# Patient Record
Sex: Female | Born: 1995 | Hispanic: Yes | Smoking: Never smoker
Health system: Southern US, Community
[De-identification: ages and names within clinical notes are randomized; demographics above are authoritative.]

## PROBLEM LIST (undated history)

## (undated) ENCOUNTER — Inpatient Hospital Stay (HOSPITAL_COMMUNITY): Payer: Self-pay

## (undated) DIAGNOSIS — Z789 Other specified health status: Secondary | ICD-10-CM

## (undated) DIAGNOSIS — Z862 Personal history of diseases of the blood and blood-forming organs and certain disorders involving the immune mechanism: Secondary | ICD-10-CM

## (undated) HISTORY — PX: NO PAST SURGERIES: SHX2092

---

## 2014-10-05 ENCOUNTER — Other Ambulatory Visit (HOSPITAL_COMMUNITY): Payer: Self-pay | Admitting: Physician Assistant

## 2014-10-05 DIAGNOSIS — Z3689 Encounter for other specified antenatal screening: Secondary | ICD-10-CM

## 2014-10-05 LAB — OB RESULTS CONSOLE RPR: RPR: NONREACTIVE

## 2014-10-05 LAB — OB RESULTS CONSOLE RUBELLA ANTIBODY, IGM: Rubella: IMMUNE

## 2014-10-05 LAB — OB RESULTS CONSOLE ABO/RH: RH Type: POSITIVE

## 2014-10-05 LAB — OB RESULTS CONSOLE HEPATITIS B SURFACE ANTIGEN: Hepatitis B Surface Ag: NEGATIVE

## 2014-10-05 LAB — OB RESULTS CONSOLE HIV ANTIBODY (ROUTINE TESTING): HIV: NONREACTIVE

## 2014-10-05 LAB — OB RESULTS CONSOLE ANTIBODY SCREEN: Antibody Screen: NEGATIVE

## 2014-10-16 ENCOUNTER — Ambulatory Visit (HOSPITAL_COMMUNITY)
Admission: RE | Admit: 2014-10-16 | Discharge: 2014-10-16 | Disposition: A | Discharge status: Home / Self Care | Payer: Medicaid Other | Source: Ambulatory Visit | Attending: Physician Assistant | Admitting: Physician Assistant

## 2014-10-16 DIAGNOSIS — Z3689 Encounter for other specified antenatal screening: Secondary | ICD-10-CM

## 2014-10-16 DIAGNOSIS — Z36 Encounter for antenatal screening of mother: Secondary | ICD-10-CM | POA: Diagnosis present

## 2014-10-25 ENCOUNTER — Other Ambulatory Visit (HOSPITAL_COMMUNITY): Payer: Self-pay | Admitting: Nurse Practitioner

## 2014-10-25 DIAGNOSIS — Z3689 Encounter for other specified antenatal screening: Secondary | ICD-10-CM

## 2014-10-26 ENCOUNTER — Other Ambulatory Visit: Payer: Self-pay | Admitting: Infectious Disease

## 2014-10-26 ENCOUNTER — Ambulatory Visit
Admission: RE | Admit: 2014-10-26 | Discharge: 2014-10-26 | Disposition: A | Discharge status: Home / Self Care | Payer: Self-pay | Source: Ambulatory Visit | Attending: Infectious Disease | Admitting: Infectious Disease

## 2014-10-26 DIAGNOSIS — R7611 Nonspecific reaction to tuberculin skin test without active tuberculosis: Secondary | ICD-10-CM

## 2014-11-13 ENCOUNTER — Encounter (HOSPITAL_COMMUNITY): Payer: Self-pay

## 2014-11-13 ENCOUNTER — Ambulatory Visit (HOSPITAL_COMMUNITY)
Admission: RE | Admit: 2014-11-13 | Discharge: 2014-11-13 | Disposition: A | Discharge status: Home / Self Care | Payer: Medicaid Other | Source: Ambulatory Visit | Attending: Nurse Practitioner | Admitting: Nurse Practitioner

## 2014-11-13 DIAGNOSIS — Z3689 Encounter for other specified antenatal screening: Secondary | ICD-10-CM

## 2014-11-13 DIAGNOSIS — IMO0002 Reserved for concepts with insufficient information to code with codable children: Secondary | ICD-10-CM | POA: Insufficient documentation

## 2014-11-13 DIAGNOSIS — Z3A22 22 weeks gestation of pregnancy: Secondary | ICD-10-CM | POA: Diagnosis not present

## 2014-11-13 DIAGNOSIS — Z36 Encounter for antenatal screening of mother: Secondary | ICD-10-CM | POA: Diagnosis not present

## 2014-11-13 DIAGNOSIS — Z0489 Encounter for examination and observation for other specified reasons: Secondary | ICD-10-CM | POA: Insufficient documentation

## 2014-12-22 NOTE — L&D Delivery Note (Cosign Needed)
Delivery Note At 3:04 PM a viable female was delivered via Vaginal, Spontaneous Delivery (Presentation: Left Occiput Anterior).  APGAR: 9, 9; weight  .   Placenta status: Intact, Spontaneous.  Cord: 3 vessels with the following complications: None.  Cord pH: n/a  Anesthesia: Epidural  Episiotomy: None Lacerations: None Suture Repair: n/a Est. Blood Loss (mL): 100  Mom to postpartum.  Baby to Couplet care / Skin to Skin.  Wyvonnia DuskyLAWSON, MARIE DARLENE 03/18/2015, 3:28 PM

## 2015-01-17 IMAGING — US US OB COMP +14 WK
1 series · 12 of 28 positions shown · non-contrast
Comparison: none

[Series 1: us ob comp +14 wk mfm · 12 of 102 slices shown]
[im 4/102]
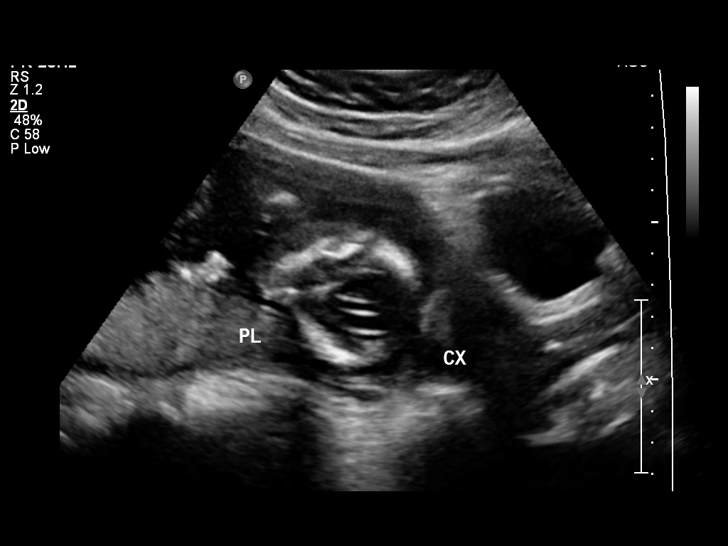
[im 12/102]
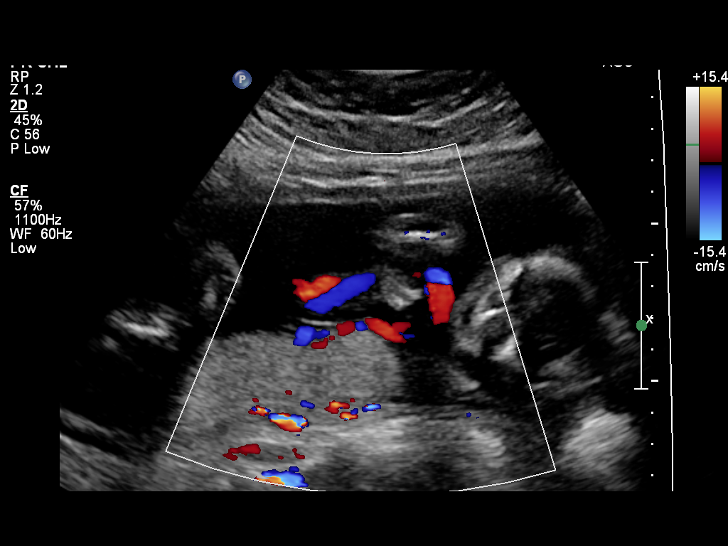
[im 19/102]
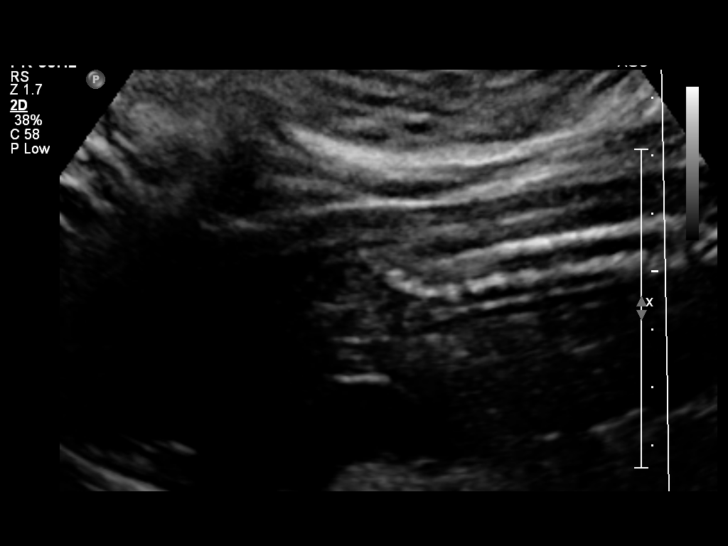
[im 30/102]
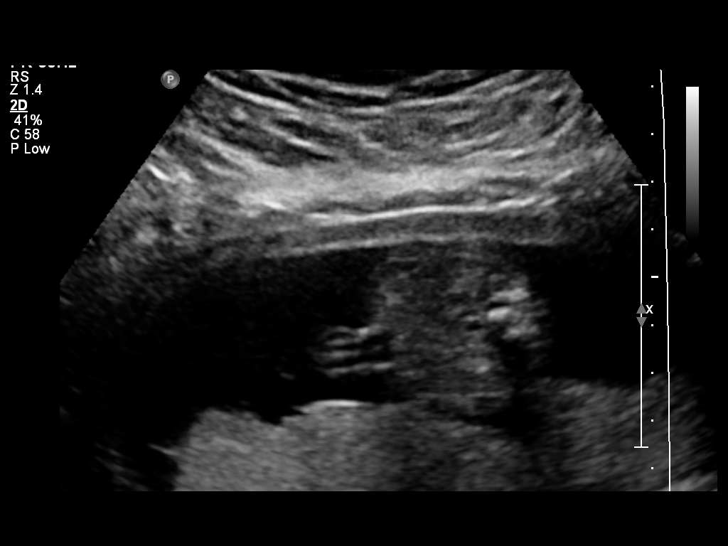
[im 38/102]
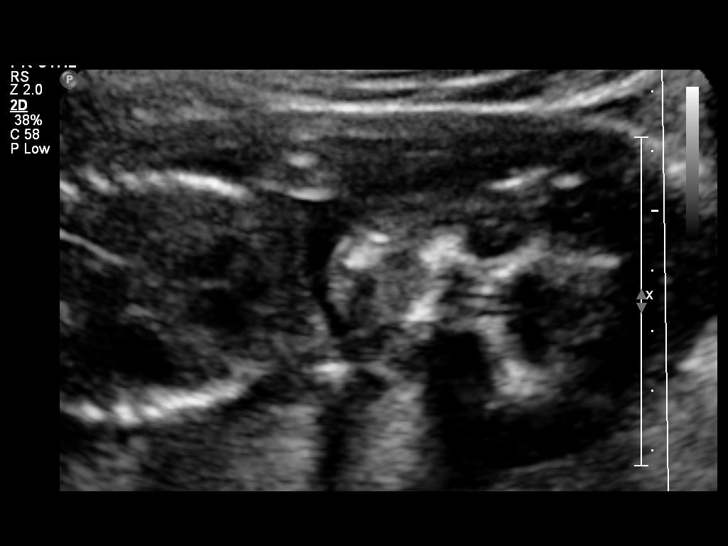
[im 45/102]
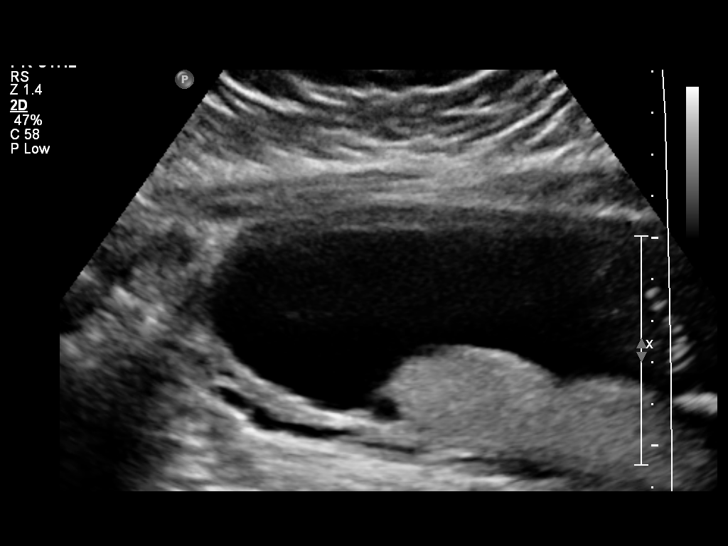
[im 57/102]
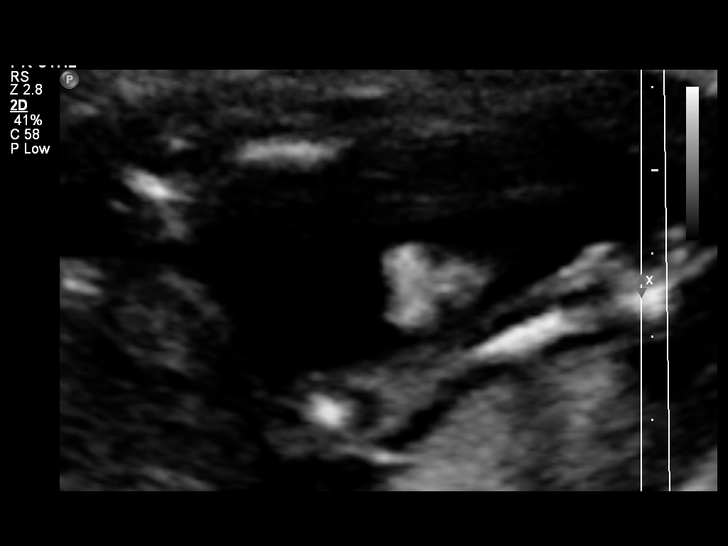
[im 64/102]
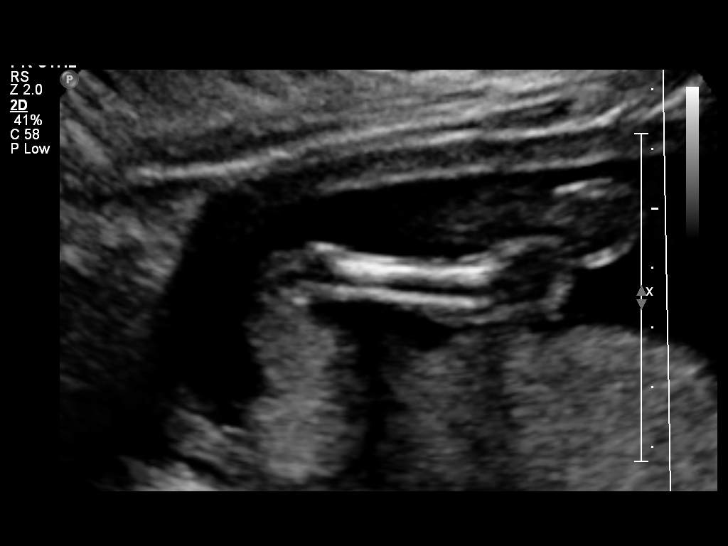
[im 72/102]
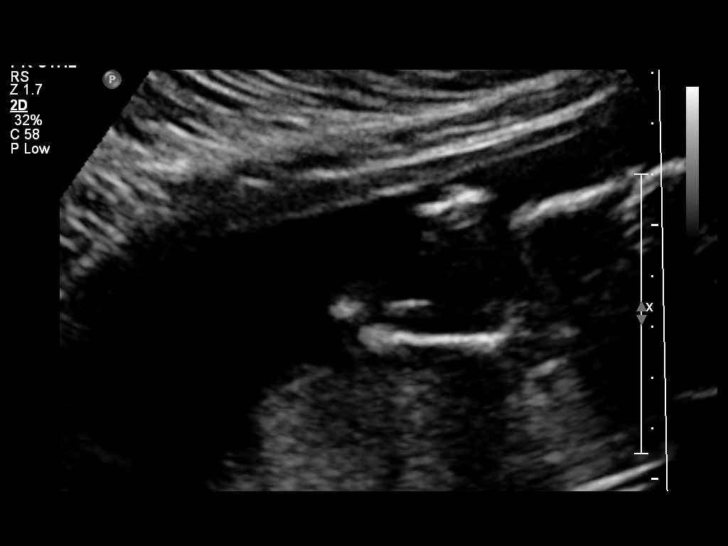
[im 83/102]
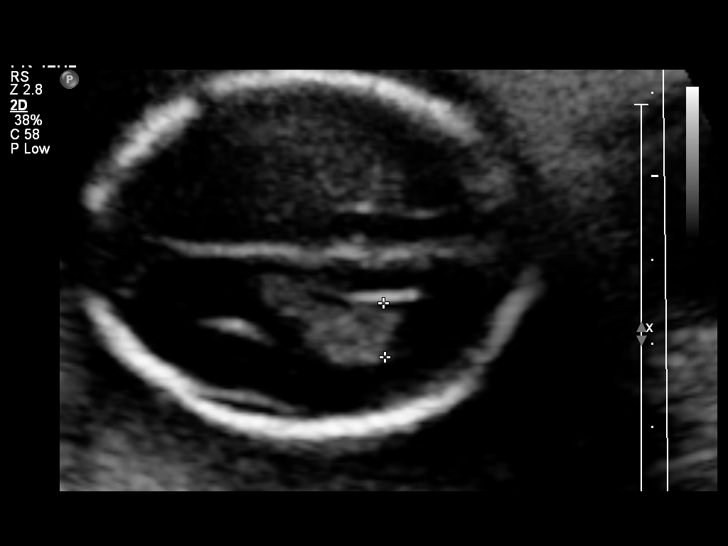
[im 90/102]
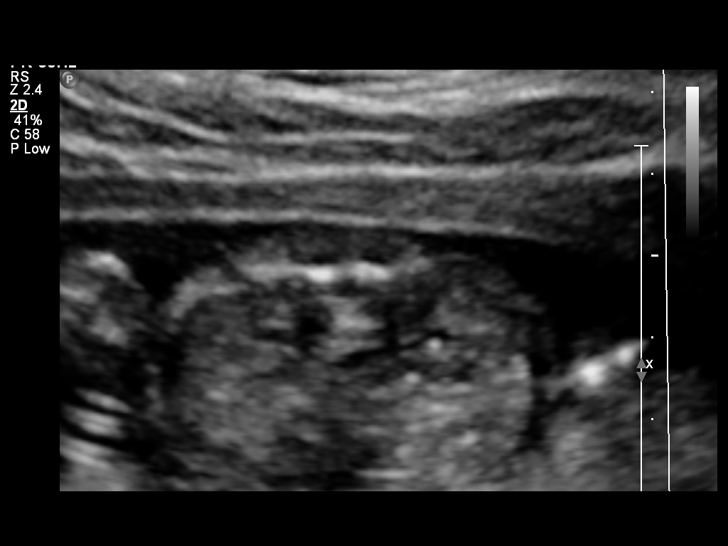
[im 98/102]
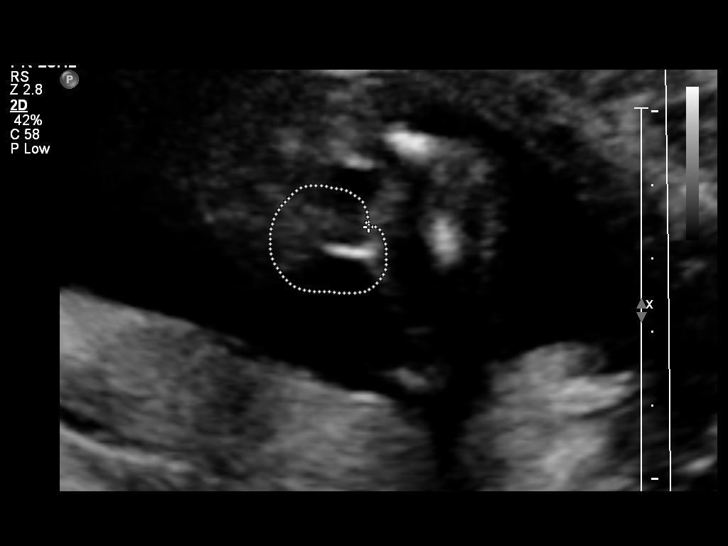

[12 of 28 positions shown; findings below may reference images not displayed]

OBSTETRICS REPORT
                      (Signed Final 10/16/2014 [DATE])

Service(s) Provided

 US OB COMP + 14 WK                                    76805.1
Indications

 Basic anatomic survey                                 [id number]
Fetal Evaluation

 Num Of Fetuses:    1
 Fetal Heart Rate:  147                          bpm
 Cardiac Activity:  Observed
 Presentation:      Cephalic
 Placenta:          Posterior, above cervical
                    os
 P. Cord            Visualized
 Insertion:

 Amniotic Fluid
 AFI FV:      Subjectively within normal limits
                                             Larg Pckt:     4.2  cm
Biometry

 BPD:     45.3  mm     G. Age:  19w 5d                CI:        78.43   70 - 86
                                                      FL/HC:      17.3   15.8 -
                                                                         18
 HC:     161.8  mm     G. Age:  19w 0d       69  %    HC/AC:      1.13   1.07 -

 AC:     142.6  mm     G. Age:  19w 4d       82  %    FL/BPD:
 FL:        28  mm     G. Age:  18w 4d       50  %    FL/AC:      19.6   20 - 24
 HUM:     27.6  mm     G. Age:  18w 6d       65  %
 NFT:      3.9  mm

 Est. FW:     276  gm    0 lb 10 oz      58  %
Gestational Age

 LMP:           18w 3d        Date:  06/09/14                 EDD:   03/16/15
 U/S Today:     19w 1d                                        EDD:   03/11/15
 Best:          18w 3d     Det. By:  LMP  (06/09/14)          EDD:   03/16/15
Anatomy

 Cranium:          Appears normal         Aortic Arch:      Appears normal
 Fetal Cavum:      Appears normal         Ductal Arch:      Not well visualized
 Ventricles:       Appears normal         Diaphragm:        Not well visualized
 Choroid Plexus:   Appears normal         Stomach:          Appears normal, left
                                                            sided
 Cerebellum:       Appears normal         Abdomen:          Appears normal
 Posterior Fossa:  Appears normal         Abdominal Wall:   Appears nml (cord
                                                            insert, abd wall)
 Nuchal Fold:      Appears normal         Cord Vessels:     Appears normal (3
                                                            vessel cord)
 Face:             Appears normal         Kidneys:          Appear normal
                   (orbits and profile)
 Lips:             Appears normal         Bladder:          Appears normal
 Heart:            Not well visualized    Spine:            Appears normal
 RVOT:             Not well visualized    Lower             Appears normal
                                          Extremities:
 LVOT:             Not well visualized    Upper             Appears normal
                                          Extremities:

 Other:  Fetus appears to be a female.Heels visualized. Technically difficult
         due to maternal habitus and fetal position.
Cervix Uterus Adnexa

 Cervical Length:    3.1      cm

 Cervix:       Normal appearance by transabdominal scan.

 Adnexa:     No abnormality visualized.
Impression

 Single IUP at 18w 3d
 Normal fetal anatomic survey
 Limited views of the fetal heart were obtained due to fetal
 position
 No markers associated with aneuploidy noted
 Normal amniotic fluid volume
Recommendations

 Recommend follow-up ultrasound examination in 4 weeks to
 complete anatomy

## 2015-01-29 ENCOUNTER — Other Ambulatory Visit (HOSPITAL_COMMUNITY): Payer: Self-pay | Admitting: Nurse Practitioner

## 2015-02-01 ENCOUNTER — Ambulatory Visit (HOSPITAL_COMMUNITY): Payer: Medicaid Other

## 2015-02-07 ENCOUNTER — Ambulatory Visit (HOSPITAL_COMMUNITY)
Admission: RE | Admit: 2015-02-07 | Discharge: 2015-02-07 | Disposition: A | Discharge status: Home / Self Care | Payer: Medicaid Other | Source: Ambulatory Visit | Attending: Nurse Practitioner | Admitting: Nurse Practitioner

## 2015-02-07 DIAGNOSIS — Z3A34 34 weeks gestation of pregnancy: Secondary | ICD-10-CM | POA: Insufficient documentation

## 2015-02-07 DIAGNOSIS — O3660X0 Maternal care for excessive fetal growth, unspecified trimester, not applicable or unspecified: Secondary | ICD-10-CM | POA: Insufficient documentation

## 2015-02-19 LAB — OB RESULTS CONSOLE GBS: STREP GROUP B AG: NEGATIVE

## 2015-02-19 LAB — OB RESULTS CONSOLE GC/CHLAMYDIA
Chlamydia: NEGATIVE
GC PROBE AMP, GENITAL: NEGATIVE

## 2015-03-15 ENCOUNTER — Inpatient Hospital Stay (HOSPITAL_COMMUNITY)
Admission: AD | Admit: 2015-03-15 | Discharge: 2015-03-15 | Disposition: A | Discharge status: Home / Self Care | Payer: Medicaid Other | Source: Ambulatory Visit | Attending: Family Medicine | Admitting: Family Medicine

## 2015-03-15 ENCOUNTER — Encounter (HOSPITAL_COMMUNITY): Payer: Self-pay

## 2015-03-15 DIAGNOSIS — Z3A40 40 weeks gestation of pregnancy: Secondary | ICD-10-CM | POA: Insufficient documentation

## 2015-03-15 HISTORY — DX: Other specified health status: Z78.9

## 2015-03-15 NOTE — MAU Note (Signed)
Urine in lab 

## 2015-03-15 NOTE — MAU Note (Signed)
Pt staes via Eda, spanish interpreter that she was closed Monday at clinic. Denies bleeding or lof. Contractions since 0730 this am, now every 5 minutes.

## 2015-03-16 ENCOUNTER — Encounter (HOSPITAL_COMMUNITY): Payer: Self-pay | Admitting: *Deleted

## 2015-03-16 ENCOUNTER — Inpatient Hospital Stay (HOSPITAL_COMMUNITY)
Admission: AD | Admit: 2015-03-16 | Discharge: 2015-03-16 | Disposition: A | Discharge status: Home / Self Care | Payer: Self-pay | Source: Ambulatory Visit | Attending: Obstetrics and Gynecology | Admitting: Obstetrics and Gynecology

## 2015-03-16 DIAGNOSIS — O471 False labor at or after 37 completed weeks of gestation: Secondary | ICD-10-CM

## 2015-03-16 DIAGNOSIS — Z3A40 40 weeks gestation of pregnancy: Secondary | ICD-10-CM | POA: Insufficient documentation

## 2015-03-16 NOTE — MAU Provider Note (Signed)
History     CSN: [id number]  Arrival date and time: 03/16/15 4098   First Provider Initiated Contact with Patient 03/16/15 [phone]      Chief Complaint  Patient presents with  . Labor Eval   HPI  Patient is 19 y.o. G1P0 [redacted]w[redacted]d, HD patient, here with complaint of contractions x 1 day.  Contraction began yesterday morning and have progressively become closer to every 5 minutes.  Contractions start in center of abdomen, wrap around to back.  She feels fetal movement, denies loss of fluid or bleeding.    Review of [facility] records demonstrate normal labs, except: varicella non-immune.  GBS is negative.  Normal u/s.  Concern for size > dates (Growth and AFI u/s on 2/17 result: AFI 14.75, 53%, EFW: 5lb 4 oz, 51%)   Past Medical History  Diagnosis Date  . Medical history non-contributory     Past Surgical History  Procedure Laterality Date  . No past surgeries      History reviewed. No pertinent family history.  History  Substance Use Topics  . Smoking status: Never Smoker   . Smokeless tobacco: Not on file  . Alcohol Use: No    Allergies:  Allergies  Allergen Reactions  . Other Other (See Comments)    PT allergic to honey/syrup (pancake)---causes face swelling.    Prescriptions prior to admission  Medication Sig Dispense Refill Last Dose  . Prenatal Vit-Fe Fumarate-FA (PRENATAL MULTIVITAMIN) TABS tablet Take 1 tablet by mouth daily at 12 noon.   03/14/2015 at Unknown time    Review of Systems  Constitutional: Negative for fever and chills.  Eyes: Negative for blurred vision.  Respiratory: Negative for cough and shortness of breath.   Cardiovascular: Negative for leg swelling.  Gastrointestinal: Negative for nausea, vomiting, abdominal pain and diarrhea.  Genitourinary: Negative for dysuria and hematuria.  Neurological: Negative for headaches.   Physical Exam   Blood pressure 125/73, pulse 69, temperature 98.2 F (36.8 C), temperature source Oral, resp. rate 18, height  4' 10.5" (1.486 m), weight 82.101 kg (181 lb), last menstrual period 06/09/2014.  Physical Exam  Constitutional: She appears well-developed and well-nourished. No distress.  HENT:  Head: Normocephalic and atraumatic.  Eyes: Conjunctivae are normal. Pupils are equal, round, and reactive to light.  Neck: Normal range of motion.  Cardiovascular: Normal rate and intact distal pulses.   Respiratory: Effort normal. No respiratory distress.  GI: Soft. She exhibits no distension. There is no tenderness.  Musculoskeletal: Normal range of motion. She exhibits no edema.  Neurological: She is alert. Coordination normal.  Skin: Skin is warm and dry. She is not diaphoretic.  Initial cervical exam: 2/80/-2.  Soft, no bleeding.  Repeat cervical exam: 2/80/-2.  Soft, no bleeding.   MAU Course  Procedures  MDM FHT: baseline 135 bpm, mod variability with accels, no decels.  UC: 4-5 min, with discomfort  Assessment and Plan  A: Patient is 19 y.o. G1P0 [redacted]w[redacted]d here with contractions.      FHT is Cat I, reassuring     No change in cervical exam after 1 hr of walking     Vertex presentation confirmed by u/s  P: Given no change in cervical exam, suspect pt is in early labor     Discussed labor precautions with patient; return when contractions closer/more regular     Patient voiced understanding of plan  Henson,Amber 03/16/2015, 9:37 AM   OB fellow attestation:  I have discussed patient with resident and agree with above documentation  in the resident's note.  Patient here for labor evaluation, no cervical change subsequent discharge, I did not examine patient.  Perry MountACOSTA,Suriyah Vergara ROCIO, MD 2:56 PM

## 2015-03-16 NOTE — MAU Note (Signed)
Pt was seen in MAU yesterday, states uc's are closer & more intense.  Denies bleeding or LOF, ? Passed mucus plug.

## 2015-03-18 ENCOUNTER — Inpatient Hospital Stay (HOSPITAL_COMMUNITY): Payer: Medicaid Other | Admitting: Anesthesiology

## 2015-03-18 ENCOUNTER — Encounter (HOSPITAL_COMMUNITY): Payer: Self-pay | Admitting: *Deleted

## 2015-03-18 ENCOUNTER — Inpatient Hospital Stay (HOSPITAL_COMMUNITY)
Admission: AD | Admit: 2015-03-18 | Discharge: 2015-03-20 | DRG: 775 | Disposition: A | Discharge status: Home / Self Care | Payer: Medicaid Other | Source: Ambulatory Visit | Attending: Family Medicine | Admitting: Family Medicine

## 2015-03-18 DIAGNOSIS — E669 Obesity, unspecified: Secondary | ICD-10-CM | POA: Diagnosis present

## 2015-03-18 DIAGNOSIS — O99214 Obesity complicating childbirth: Secondary | ICD-10-CM | POA: Diagnosis present

## 2015-03-18 DIAGNOSIS — Z3A40 40 weeks gestation of pregnancy: Secondary | ICD-10-CM | POA: Diagnosis present

## 2015-03-18 DIAGNOSIS — IMO0001 Reserved for inherently not codable concepts without codable children: Secondary | ICD-10-CM

## 2015-03-18 HISTORY — DX: Personal history of diseases of the blood and blood-forming organs and certain disorders involving the immune mechanism: Z86.2

## 2015-03-18 LAB — TYPE AND SCREEN
ABO/RH(D): A POS
Antibody Screen: NEGATIVE

## 2015-03-18 LAB — CBC
HEMATOCRIT: 36.5 % (ref 36.0–46.0)
Hemoglobin: 12.6 g/dL (ref 12.0–15.0)
MCH: 30.6 pg (ref 26.0–34.0)
MCHC: 34.5 g/dL (ref 30.0–36.0)
MCV: 88.6 fL (ref 78.0–100.0)
PLATELETS: 206 10*3/uL (ref 150–400)
RBC: 4.12 MIL/uL (ref 3.87–5.11)
RDW: 13.4 % (ref 11.5–15.5)
WBC: 8.9 10*3/uL (ref 4.0–10.5)

## 2015-03-18 LAB — ABO/RH: ABO/RH(D): A POS

## 2015-03-18 LAB — RPR: RPR Ser Ql: NONREACTIVE

## 2015-03-18 MED ORDER — IBUPROFEN 600 MG PO TABS
600.0000 mg | ORAL_TABLET | Freq: Four times a day (QID) | ORAL | Status: DC
  Administered 2015-03-18 – 2015-03-20 (×8): 600 mg via ORAL
  Filled 2015-03-18 (×8): qty 1

## 2015-03-18 MED ORDER — LACTATED RINGERS IV SOLN
500.0000 mL | Freq: Once | INTRAVENOUS | Status: AC
  Administered 2015-03-18: 500 mL via INTRAVENOUS

## 2015-03-18 MED ORDER — CITRIC ACID-SODIUM CITRATE 334-500 MG/5ML PO SOLN: 30.0000 mL | ORAL | Status: DC | PRN

## 2015-03-18 MED ORDER — TERBUTALINE SULFATE 1 MG/ML IJ SOLN
0.2500 mg | Freq: Once | INTRAMUSCULAR | Status: AC | PRN
  Filled 2015-03-18: qty 1

## 2015-03-18 MED ORDER — LIDOCAINE HCL (PF) 1 % IJ SOLN
30.0000 mL | INTRAMUSCULAR | Status: DC | PRN
  Filled 2015-03-18: qty 30

## 2015-03-18 MED ORDER — DIBUCAINE 1 % RE OINT: 1.0000 "application " | TOPICAL_OINTMENT | RECTAL | Status: DC | PRN

## 2015-03-18 MED ORDER — SODIUM CHLORIDE 0.9 % IV SOLN: 250.0000 mL | INTRAVENOUS | Status: DC | PRN

## 2015-03-18 MED ORDER — TETANUS-DIPHTH-ACELL PERTUSSIS 5-2.5-18.5 LF-MCG/0.5 IM SUSP: 0.5000 mL | Freq: Once | INTRAMUSCULAR | Status: DC

## 2015-03-18 MED ORDER — LACTATED RINGERS IV SOLN
500.0000 mL | INTRAVENOUS | Status: DC | PRN
Start: 2015-03-18 — End: 2015-03-20
  Administered 2015-03-18: 300 mL via INTRAVENOUS

## 2015-03-18 MED ORDER — DIPHENHYDRAMINE HCL 25 MG PO CAPS: 25.0000 mg | ORAL_CAPSULE | Freq: Four times a day (QID) | ORAL | Status: DC | PRN

## 2015-03-18 MED ORDER — ZOLPIDEM TARTRATE 5 MG PO TABS: 5.0000 mg | ORAL_TABLET | Freq: Every evening | ORAL | Status: DC | PRN

## 2015-03-18 MED ORDER — EPHEDRINE 5 MG/ML INJ
10.0000 mg | INTRAVENOUS | Status: DC | PRN
  Filled 2015-03-18: qty 2

## 2015-03-18 MED ORDER — PHENYLEPHRINE 40 MCG/ML (10ML) SYRINGE FOR IV PUSH (FOR BLOOD PRESSURE SUPPORT)
PREFILLED_SYRINGE | INTRAVENOUS | Status: AC
  Filled 2015-03-18: qty 20

## 2015-03-18 MED ORDER — BENZOCAINE-MENTHOL 20-0.5 % EX AERO
1.0000 "application " | INHALATION_SPRAY | CUTANEOUS | Status: DC | PRN
  Filled 2015-03-18: qty 56

## 2015-03-18 MED ORDER — LIDOCAINE HCL (PF) 1 % IJ SOLN
INTRAMUSCULAR | Status: DC | PRN
  Administered 2015-03-18: 4 mL
  Administered 2015-03-18: 3 mL

## 2015-03-18 MED ORDER — FLEET ENEMA 7-19 GM/118ML RE ENEM: 1.0000 | ENEMA | RECTAL | Status: DC | PRN

## 2015-03-18 MED ORDER — OXYTOCIN BOLUS FROM INFUSION
500.0000 mL | INTRAVENOUS | Status: DC
  Administered 2015-03-18: 500 mL via INTRAVENOUS

## 2015-03-18 MED ORDER — ONDANSETRON HCL 4 MG PO TABS: 4.0000 mg | ORAL_TABLET | ORAL | Status: DC | PRN

## 2015-03-18 MED ORDER — ACETAMINOPHEN 325 MG PO TABS: 650.0000 mg | ORAL_TABLET | ORAL | Status: DC | PRN

## 2015-03-18 MED ORDER — FENTANYL 2.5 MCG/ML BUPIVACAINE 1/10 % EPIDURAL INFUSION (WH - ANES)
INTRAMUSCULAR | Status: AC
  Administered 2015-03-18: 14 mL/h via EPIDURAL
  Filled 2015-03-18: qty 125

## 2015-03-18 MED ORDER — ONDANSETRON HCL 4 MG/2ML IJ SOLN: 4.0000 mg | Freq: Four times a day (QID) | INTRAMUSCULAR | Status: DC | PRN

## 2015-03-18 MED ORDER — PHENYLEPHRINE 40 MCG/ML (10ML) SYRINGE FOR IV PUSH (FOR BLOOD PRESSURE SUPPORT)
80.0000 ug | PREFILLED_SYRINGE | INTRAVENOUS | Status: DC | PRN
  Filled 2015-03-18: qty 2

## 2015-03-18 MED ORDER — OXYTOCIN 40 UNITS IN LACTATED RINGERS INFUSION - SIMPLE MED
1.0000 m[IU]/min | INTRAVENOUS | Status: DC
  Administered 2015-03-18: 1 m[IU]/min via INTRAVENOUS
  Filled 2015-03-18: qty 1000

## 2015-03-18 MED ORDER — WITCH HAZEL-GLYCERIN EX PADS: 1.0000 "application " | MEDICATED_PAD | CUTANEOUS | Status: DC | PRN

## 2015-03-18 MED ORDER — OXYCODONE-ACETAMINOPHEN 5-325 MG PO TABS: 1.0000 | ORAL_TABLET | ORAL | Status: DC | PRN

## 2015-03-18 MED ORDER — LANOLIN HYDROUS EX OINT: TOPICAL_OINTMENT | CUTANEOUS | Status: DC | PRN

## 2015-03-18 MED ORDER — DIPHENHYDRAMINE HCL 50 MG/ML IJ SOLN: 12.5000 mg | INTRAMUSCULAR | Status: DC | PRN

## 2015-03-18 MED ORDER — OXYTOCIN 40 UNITS IN LACTATED RINGERS INFUSION - SIMPLE MED: 62.5000 mL/h | INTRAVENOUS | Status: DC | PRN

## 2015-03-18 MED ORDER — SENNOSIDES-DOCUSATE SODIUM 8.6-50 MG PO TABS
2.0000 | ORAL_TABLET | ORAL | Status: DC
  Administered 2015-03-19 (×2): 2 via ORAL
  Filled 2015-03-18 (×2): qty 2

## 2015-03-18 MED ORDER — SODIUM CHLORIDE 0.9 % IJ SOLN: 3.0000 mL | INTRAMUSCULAR | Status: DC | PRN

## 2015-03-18 MED ORDER — LACTATED RINGERS IV SOLN
INTRAVENOUS | Status: DC
  Administered 2015-03-18 (×2): via INTRAVENOUS

## 2015-03-18 MED ORDER — METHYLERGONOVINE MALEATE 0.2 MG/ML IJ SOLN
INTRAMUSCULAR | Status: AC
  Administered 2015-03-18: 0.2 mg via INTRAMUSCULAR
  Filled 2015-03-18: qty 1

## 2015-03-18 MED ORDER — SODIUM CHLORIDE 0.9 % IJ SOLN
3.0000 mL | Freq: Two times a day (BID) | INTRAMUSCULAR | Status: DC
  Administered 2015-03-18: 3 mL via INTRAVENOUS

## 2015-03-18 MED ORDER — ONDANSETRON HCL 4 MG/2ML IJ SOLN
4.0000 mg | INTRAMUSCULAR | Status: DC | PRN
Start: 2015-03-18 — End: 2015-03-20

## 2015-03-18 MED ORDER — PRENATAL MULTIVITAMIN CH
1.0000 | ORAL_TABLET | Freq: Every day | ORAL | Status: DC
Start: 2015-03-19 — End: 2015-03-20
  Administered 2015-03-19 – 2015-03-20 (×2): 1 via ORAL
  Filled 2015-03-18 (×2): qty 1

## 2015-03-18 MED ORDER — NALBUPHINE HCL 10 MG/ML IJ SOLN
5.0000 mg | INTRAMUSCULAR | Status: DC | PRN
Start: 2015-03-18 — End: 2015-03-20
  Administered 2015-03-18: 5 mg via INTRAVENOUS
  Filled 2015-03-18: qty 1

## 2015-03-18 MED ORDER — OXYTOCIN 40 UNITS IN LACTATED RINGERS INFUSION - SIMPLE MED: 62.5000 mL/h | INTRAVENOUS | Status: DC

## 2015-03-18 MED ORDER — OXYCODONE-ACETAMINOPHEN 5-325 MG PO TABS: 2.0000 | ORAL_TABLET | ORAL | Status: DC | PRN

## 2015-03-18 MED ORDER — METHYLERGONOVINE MALEATE 0.2 MG/ML IJ SOLN
0.2000 mg | Freq: Once | INTRAMUSCULAR | Status: AC
  Administered 2015-03-18: 0.2 mg via INTRAMUSCULAR

## 2015-03-18 MED ORDER — SIMETHICONE 80 MG PO CHEW: 80.0000 mg | CHEWABLE_TABLET | ORAL | Status: DC | PRN

## 2015-03-18 MED ORDER — FENTANYL 2.5 MCG/ML BUPIVACAINE 1/10 % EPIDURAL INFUSION (WH - ANES)
INTRAMUSCULAR | Status: DC | PRN
  Administered 2015-03-18: 12 mL/h via EPIDURAL

## 2015-03-18 MED ORDER — FENTANYL 2.5 MCG/ML BUPIVACAINE 1/10 % EPIDURAL INFUSION (WH - ANES)
14.0000 mL/h | INTRAMUSCULAR | Status: DC | PRN
  Administered 2015-03-18: 14 mL/h via EPIDURAL

## 2015-03-18 NOTE — Progress Notes (Signed)
Assisted RN with questions concerning immunizations.  Spanish Interpreter

## 2015-03-18 NOTE — Anesthesia Procedure Notes (Signed)
Epidural Patient location during procedure: OB Start time: 03/18/2015 8:52 AM  Staffing Anesthesiologist: Mal AmabileFOSTER, Zarif Rathje  Preanesthetic Checklist Completed: patient identified, site marked, surgical consent, pre-op evaluation, timeout performed, IV checked, risks and benefits discussed and monitors and equipment checked  Epidural Patient position: sitting Prep: site prepped and draped and DuraPrep Patient monitoring: continuous pulse ox and blood pressure Approach: midline Location: L4-L5 Injection technique: LOR air  Needle:  Needle type: Tuohy  Needle gauge: 17 G Needle length: 9 cm and 9 Needle insertion depth: 7 cm Catheter type: closed end flexible Catheter size: 19 Gauge Catheter at skin depth: 12 cm Test dose: negative and Other  Assessment Events: blood not aspirated, injection not painful, no injection resistance, negative IV test and no paresthesia  Additional Notes Patient identified. Risks and benefits discussed including failed block, incomplete  Pain control, post dural puncture headache, nerve damage, paralysis, blood pressure Changes, nausea, vomiting, reactions to medications-both toxic and allergic and post Partum back pain. All questions were answered. Patient expressed understanding and wished to proceed. Sterile technique was used throughout procedure. Epidural site was Dressed with sterile barrier dressing. No paresthesias, signs of intravascular injection Or signs of intrathecal spread were encountered.  Patient was more comfortable after the epidural was dosed. Please see RN's note for documentation of vital signs and FHR which are stable.

## 2015-03-18 NOTE — Progress Notes (Signed)
Spanish Interpreter, Cedar CreekMarta, present for epidural placement

## 2015-03-18 NOTE — Progress Notes (Signed)
Hart RochesterLawson, CNM, notified of large gush of blood with clots and fundus deviated after being firm 15 minutes prior. Notified in and out catheter done and WNL VS.Order received to give 0.2 mg of methergine IM.

## 2015-03-18 NOTE — Progress Notes (Addendum)
Assisted Pharmacy Tech with interpretation of questions concerning medications. Patient had received dinner and was eating.  Spanish Interpreter

## 2015-03-18 NOTE — Progress Notes (Signed)
Checked on patients needs.   Patient had a few questions for RN.  Spanish interpreter

## 2015-03-18 NOTE — Progress Notes (Signed)
Assisted staff with interpretation of delivery.  Spanish interpreter

## 2015-03-18 NOTE — Progress Notes (Signed)
Dot LanesMaria Garcia-Maya is a 19 y.o. G1P0 at 5950w2d  admitted for active labor  Subjective:   Objective: BP 135/78 mmHg  Pulse 68  Temp(Src) 98.2 F (36.8 C) (Oral)  Resp 18  Ht 4\' 10"  (1.473 m)  Wt 82.101 kg (181 lb)  BMI 37.84 kg/m2  LMP 06/09/2014      FHT:  FHR: 140 bpm, variability: moderate,  accelerations:  Present,  decelerations:  Absent UC:   regular, every 10 minutes SVE:   Dilation: 5.5 Effacement (%): 100 Station: -2 Exam by:: Renaldo HarrisonGoss, RNC  Labs: Lab Results  Component Value Date   WBC 8.9 03/18/2015   HGB 12.6 03/18/2015   HCT 36.5 03/18/2015   MCV 88.6 03/18/2015   PLT 206 03/18/2015    Assessment / Plan: Protracted latent phase  Labor: will start pitocin at this time.  Preeclampsia:  NA Fetal Wellbeing:  Category I Pain Control:  Labor support without medications I/D:  n/a Anticipated MOD:  NSVD  Tawnya CrookHogan, Teosha Casso Donovan 03/18/2015, 8:20 AM

## 2015-03-18 NOTE — MAU Note (Signed)
Pt reprots having ctx on and off since Thursday got strong and closer tonight. Reprots some mucusy discharge and bloody show. Good fetal movment.

## 2015-03-18 NOTE — Progress Notes (Addendum)
Assisted RN with interpretation of unit admit to mother baby. Also ordered patient dinner and breakfast. Spanish interpreter

## 2015-03-18 NOTE — Progress Notes (Signed)
Spanish interpreter, Stephanie Beltran, at bedside for delivery

## 2015-03-18 NOTE — Progress Notes (Signed)
Dot LanesMaria Garcia-Maya is a 19 y.o. G1P0 at 6333w2d by ultrasound admitted for active labor  Subjective:   Objective: BP 106/61 mmHg  Pulse 76  Temp(Src) 98 F (36.7 C) (Oral)  Resp 20  Ht 4\' 10"  (1.473 m)  Wt 181 lb (82.101 kg)  BMI 37.84 kg/m2  SpO2 99%  LMP 06/09/2014      FHT:  FHR: 130 bpm, variability: moderate,  accelerations:  Abscent,  decelerations:  Present early decels UC:   regular, every 2-5 minutes SVE:   Dilation: 7.5 Effacement (%): 100 Station: -2, -1 Exam by:: Hart RochesterLawson, CNM  Labs: Lab Results  Component Value Date   WBC 8.9 03/18/2015   HGB 12.6 03/18/2015   HCT 36.5 03/18/2015   MCV 88.6 03/18/2015   PLT 206 03/18/2015    Assessment / Plan: Augmentation of labor, progressing well  Labor: Progressing normally Preeclampsia:  no signs or symptoms of toxicity Fetal Wellbeing:  Category II Pain Control:  Epidural I/D:  n/a Anticipated MOD:  NSVD  LAWSON, MARIE DARLENE 03/18/2015, 11:35 AM

## 2015-03-18 NOTE — Progress Notes (Signed)
Marta, Spanish interpreter, at bedside for shift assessment.

## 2015-03-18 NOTE — H&P (Signed)
Stephanie LanesMaria Beltran is a 19 y.o. female presenting for labor evaluation. Maternal Medical History:  Reason for admission: Contractions.  Nausea.  Contractions: Frequency: regular.   Perceived severity is strong.    Fetal activity: Perceived fetal activity is normal.   Last perceived fetal movement was within the past hour.    Prenatal Complications - Diabetes: none.    OB History    Gravida Para Term Preterm AB TAB SAB Ectopic Multiple Living   1        1      Past Medical History  Diagnosis Date  . Medical history non-contributory    Past Surgical History  Procedure Laterality Date  . No past surgeries     Family History: family history is not on file. Social History:  reports that she has never smoked. She does not have any smokeless tobacco history on file. She reports that she does not drink alcohol or use illicit drugs.   Prenatal Transfer Tool  Maternal Diabetes: No Genetic Screening: Normal Maternal Ultrasounds/Referrals: Normal Fetal Ultrasounds or other Referrals:  None Maternal Substance Abuse:  No Significant Maternal Medications:  None Significant Maternal Lab Results:  None Other Comments:  None  Review of Systems  Constitutional: Negative for fever.  Eyes: Negative for blurred vision.  Gastrointestinal: Positive for abdominal pain. Negative for nausea.  Genitourinary: Negative for dysuria.  Neurological: Negative for headaches.    Dilation: 5.5 Effacement (%): 100 Station: -2, -1 Exam by:: K.WIlson,RN Blood pressure 126/71, pulse 61, temperature 98.7 F (37.1 C), temperature source Oral, height 4\' 10"  (1.473 m), weight 82.101 kg (181 lb), last menstrual period 06/09/2014. Maternal Exam:  Uterine Assessment: Contraction strength is firm.  Contraction duration is 60 seconds. Contraction frequency is regular.   Introitus: Normal vulva. Normal vagina.  Cervix: Cervix evaluated by digital exam.     Fetal Exam Fetal Monitor Review: Mode:  ultrasound.   Baseline rate: 140.  Variability: moderate (6-25 bpm).   Pattern: accelerations present and no decelerations.    Fetal State Assessment: Category I - tracings are normal.     Physical Exam  Nursing note and vitals reviewed. Constitutional: She is oriented to person, place, and time. She appears well-developed and well-nourished. No distress.  Cardiovascular: Normal rate.   Respiratory: Effort normal.  GI: Soft.  Neurological: She is alert and oriented to person, place, and time.  Skin: Skin is warm and dry.  Psychiatric: She has a normal mood and affect.    Prenatal labs: ABO, Rh: --/--/A POS (03/27 0400) Antibody: PENDING (03/27 0400) Rubella: Immune (10/15 0000) RPR: Nonreactive (10/15 0000)  HBsAg: Negative (10/15 0000)  HIV: Non-reactive (10/15 0000)  GBS: Negative (02/29 0000)   Assessment/Plan: Active labor at term Admit to labor and delivery Routine orders Expectant management, anticipated NSVD    Tawnya CrookHogan, Heather Donovan 03/18/2015, 5:05 AM

## 2015-03-18 NOTE — Anesthesia Preprocedure Evaluation (Signed)
Anesthesia Evaluation  Patient identified by MRN, date of birth, ID band Patient awake    Reviewed: Allergy & Precautions, Patient's Chart, lab work & pertinent test results  Airway Mallampati: III  TM Distance: >3 FB Neck ROM: Full    Dental no notable dental hx. (+) Teeth Intact   Pulmonary neg pulmonary ROS,  breath sounds clear to auscultation  Pulmonary exam normal       Cardiovascular negative cardio ROS  Rhythm:Regular Rate:Normal     Neuro/Psych negative neurological ROS  negative psych ROS   GI/Hepatic negative GI ROS, Neg liver ROS,   Endo/Other  obesity  Renal/GU negative Renal ROS  negative genitourinary   Musculoskeletal negative musculoskeletal ROS (+)   Abdominal (+) + obese,   Peds  Hematology negative hematology ROS (+)   Anesthesia Other Findings   Reproductive/Obstetrics (+) Pregnancy                             Anesthesia Physical Anesthesia Plan  ASA: II  Anesthesia Plan: Epidural   Post-op Pain Management:    Induction:   Airway Management Planned: Natural Airway  Additional Equipment:   Intra-op Plan:   Post-operative Plan:   Informed Consent: I have reviewed the patients History and Physical, chart, labs and discussed the procedure including the risks, benefits and alternatives for the proposed anesthesia with the patient or authorized representative who has indicated his/her understanding and acceptance.     Plan Discussed with: Anesthesiologist  Anesthesia Plan Comments:         Anesthesia Quick Evaluation

## 2015-03-18 NOTE — Progress Notes (Signed)
Spanish interpreter, Octavio GravesBonita, relieving MineolaMarta for delivery/recovery

## 2015-03-19 ENCOUNTER — Encounter (HOSPITAL_COMMUNITY): Payer: Self-pay | Admitting: *Deleted

## 2015-03-19 NOTE — Progress Notes (Signed)
I stopped by patients's room to check on her needs, I ordered her dinner, snack and breakfast, by Orlan LeavensViria Alvarez Spanish Interpreter.

## 2015-03-19 NOTE — Lactation Note (Signed)
This note was copied from the chart of Stephanie Beltran. Lactation Consultation Note New mom has very WIDE space between breast, approx. 4 in. Or more. Has small amount of breast tissue, most of breast tissue are towards tail of spence. Hand expressed Rt. Breast a little dot of colostrum, the Lt. Nipple is sore from baby BF. Mom stated it was very painful. Mom has very small nipples that are coned shaped w/breast. Mom denies Dx: of PCOS. Mom doesn't speak [place], she speaks [place]. FOB speaks [place]. Called for interpreter for teaching. Spanish interpreter at bedside for teaching of BF, DEBP set up and cleaning, reason for pump, and need for probable supplementing baby. Supplementing baby information sheet given in spanish, mom has WIC so Similac given w/measuring cup and slow flow nipple.  Mom is to put baby to breast and BF 1st, then pump, if she obtains any milk then give to baby, if not then supplement w/formula. Mom is Breast and bottle on plan per mom. Mom taught how to apply & clean nipple shield. Fitted #16 NS d/t unable to latch deep. Mom stated felt much better.  Mom encouraged to feed baby 8-12 times/24 hours and with feeding cues. Mom encouraged to waken baby for feeds. Mom encouraged to do skin-to-skin. Educated about newborn behavior. Referred to Baby and Me Book in Breastfeeding section Pg. 22-23 for position options and Proper latch demonstration.Paced bottle-feeding taught. Mother informed of post-discharge support and given phone number to the lactation department, including services for phone call assistance; out-patient appointments; and breastfeeding support group. List of other breastfeeding resources in the community given in the handout. Encouraged mother to call for problems or concerns related to breastfeeding. Patient Name: Stephanie Beltran ZOXWR'UToday's Date: 03/19/2015 Reason for consult: Initial assessment   Maternal Data Has patient been taught Hand Expression?:  Yes Does the patient have breastfeeding experience prior to this delivery?: No  Feeding Feeding Type: Formula (BF also) Nipple Type: Slow - flow Length of feed: 10 min  LATCH Score/Interventions Latch: Repeated attempts needed to sustain latch, nipple held in mouth throughout feeding, stimulation needed to elicit sucking reflex. Intervention(s): Skin to skin;Teach feeding cues;Waking techniques Intervention(s): Adjust position;Assist with latch;Breast massage;Breast compression  Audible Swallowing: A few with stimulation Intervention(s): Skin to skin;Hand expression Intervention(s): Hand expression  Type of Nipple: Flat Intervention(s): Double electric pump  Comfort (Breast/Nipple): Filling, red/small blisters or bruises, mild/mod discomfort  Problem noted: Mild/Moderate discomfort Interventions (Mild/moderate discomfort): Breast shields;Post-pump;Hand expression;Hand massage  Hold (Positioning): Assistance needed to correctly position infant at breast and maintain latch. Intervention(s): Breastfeeding basics reviewed;Support Pillows;Position options;Skin to skin  LATCH Score: 5  Lactation Tools Discussed/Used Tools: Nipple Dorris CarnesShields;Pump Nipple shield size: 16 Breast pump type: Double-Electric Breast Pump WIC Program: Yes Pump Review: Setup, frequency, and cleaning;Milk Storage Initiated by:: Peri JeffersonL. Francie Keeling RN Date initiated:: 03/19/15   Consult Status Consult Status: Follow-up Date: 03/19/15 (IN PM) Follow-up type: In-patient    Charyl DancerCARVER, Jazaria Jarecki G 03/19/2015, 5:48 AM

## 2015-03-19 NOTE — Progress Notes (Signed)
I assisted Museum/gallery curatorMichelle RN with some questions and information about PKU and Hepatitis B vaccine, by Orlan LeavensViria Alvarez Spanish Interpreter

## 2015-03-19 NOTE — Progress Notes (Signed)
UR chart review completed.  

## 2015-03-19 NOTE — Progress Notes (Signed)
I stopped by to check on patient and ordered her lunch.  Stephanie Beltran  Interpreter. °

## 2015-03-19 NOTE — Progress Notes (Signed)
Post Partum Day 1 Subjective: no complaints, up ad lib, voiding and tolerating PO  Objective: Blood pressure 108/55, pulse 65, temperature 98.1 F (36.7 C), temperature source Oral, resp. rate 18, height 4\' 10"  (1.473 m), weight 181 lb (82.101 kg), last menstrual period 06/09/2014, SpO2 100 %, unknown if currently breastfeeding.  Physical Exam:  General: alert, cooperative, appears stated age and no distress Lochia: appropriate Uterine Fundus: firm Incision: healing well DVT Evaluation: No evidence of DVT seen on physical exam. Negative Homan's sign. No cords or calf tenderness. No significant calf/ankle edema.   Recent Labs  03/18/15 0400  HGB 12.6  HCT 36.5    Assessment/Plan: Plan for discharge tomorrow   LOS: 1 day   LAWSON, MARIE DARLENE 03/19/2015, 7:19 AM

## 2015-03-19 NOTE — Lactation Note (Signed)
This note was copied from the chart of Stephanie Beltran. Lactation Consultation Note  Patient Name: Stephanie Beltran ZOXWR'UToday's Date: 03/19/2015 Reason for consult: Follow-up assessment;Hyperbilirubinemia (monitoring levels) and mom both breast and bottle-feeding per choice.  LC spoke with mom through Spanish-speaking RN, Doran HeaterMarisela who is at bedside while baby is having blood drawn and STS with mom.  Mom has been pumping using hand pump so use of DEBP encouraged, along with hand expression and breast massage for maximum stimulation of milk production.  Mom continues also breastfeeding using NS and supplementing with bottle/formula.  LC encouraged regular pumping to stimulate her milk supply (post-pump).   Maternal Data    Feeding Feeding Type: Formula Length of feed: 10 min  LATCH Score/Interventions Latch: Repeated attempts needed to sustain latch, nipple held in mouth throughout feeding, stimulation needed to elicit sucking reflex. Intervention(s): Teach feeding cues;Waking techniques Intervention(s): Adjust position;Assist with latch;Breast massage;Breast compression  Audible Swallowing: A few with stimulation Intervention(s): Hand expression Intervention(s): Alternate breast massage  Type of Nipple: Flat Intervention(s): Double electric pump  Comfort (Breast/Nipple): Filling, red/small blisters or bruises, mild/mod discomfort Problem noted: Cracked, bleeding, blisters, bruises Intervention(s): Other (comment) (nipple shield)  Interventions (Mild/moderate discomfort): Hand massage;Breast shields  Hold (Positioning): No assistance needed to correctly position infant at breast. Intervention(s): Breastfeeding basics reviewed;Support Pillows;Position options  LATCH Score: 6 (previous LATCH assessment, per RN)  Lactation Tools Discussed/Used Pump Review: Milk Storage (reviewed page 25 in Baby and Me (Spanish)) DEBP and cue feeding at breast  Consult Status Consult  Status: Follow-up Date: 03/20/15 Follow-up type: In-patient    Warrick ParisianBryant, Mathew Storck [facility] 03/19/2015, 8:21 PM

## 2015-03-19 NOTE — Anesthesia Postprocedure Evaluation (Signed)
  Anesthesia Post-op Note  Patient: Stephanie Beltran  Procedure(s) Performed: * No procedures listed *  Patient Location: Mother/Baby  Anesthesia Type:Epidural  Level of Consciousness: awake  Airway and Oxygen Therapy: Patient Spontanous Breathing  Post-op Pain: mild  Post-op Assessment: Patient's Cardiovascular Status Stable and Respiratory Function Stable  Post-op Vital Signs: stable  Last Vitals:  Filed Vitals:   03/19/15 0625  BP: 108/55  Pulse: 65  Temp: 36.7 C  Resp: 18    Complications: No apparent anesthesia complications

## 2015-03-20 MED ORDER — IBUPROFEN 600 MG PO TABS: 600.0000 mg | ORAL_TABLET | Freq: Four times a day (QID) | ORAL | Status: DC

## 2015-03-20 NOTE — Discharge Instructions (Signed)

## 2015-03-20 NOTE — Progress Notes (Signed)
I stopped by to check on patient's needs.  Eda H Royal Interpreter. °

## 2015-03-20 NOTE — Progress Notes (Signed)
I assisted Marcelino DusterMichelle, RN with explanation of care plan for the Baby and ordered patient's lunch. Eda H Royal  Interpreter.

## 2015-03-20 NOTE — Discharge Summary (Cosign Needed)
Obstetric Discharge Summary Reason for Admission: onset of labor Prenatal Procedures: none Intrapartum Procedures: spontaneous vaginal delivery Postpartum Procedures: none Complications-Operative and Postpartum: none  Delivery Note At 3:04 PM a viable female was delivered via Vaginal, Spontaneous Delivery (Presentation: Left Occiput Anterior). APGAR: 9, 9; weight .  Placenta status: Intact, Spontaneous. Cord: 3 vessels with the following complications: None. Cord pH: n/a  Anesthesia: Epidural  Episiotomy: None Lacerations: None Suture Repair: n/a Est. Blood Loss (mL): 100  Mom to postpartum. Baby to Couplet care / Skin to Skin.  Stephanie Beltran, Stephanie Beltran 03/18/2015, 3:28 PM  Hospital Course:  Active Problems:   Active labor at term   Stephanie Beltran is a 19 y.o. G1P1001 s/p SVD.  Patient was admitted on 03/18/15 for onset of labor.  She has postpartum course that was uncomplicated including no problems with ambulating, PO intake, urination, pain, or bleeding. The pt feels ready to go home and  will be discharged with outpatient follow-up.   Today: No acute events overnight.  Pt denies problems with ambulating, voiding or po intake.  She denies nausea or vomiting.  Pain is well controlled.  She has had flatus. She has had bowel movement.  Lochia Small.  Plan for birth control is  condoms.  Method of Feeding: Breast  Physical Exam:  General: alert, cooperative, appears stated age and no distress Lochia: appropriate Uterine Fundus: firm  DVT Evaluation: No evidence of DVT seen on physical exam. Negative Homan's sign. No cords or calf tenderness. No significant calf/ankle edema.  H/H: Lab Results  Component Value Date/Time   HGB 12.6 03/18/2015 04:00 AM   HCT 36.5 03/18/2015 04:00 AM    Discharge Diagnoses: Term Pregnancy-delivered  Discharge Information: Date: 03/20/2015 Activity: pelvic rest Diet: routine  Medications: PNV Breast feeding:  Yes Condition:  stable Instructions: refer to handout Discharge to: home, f/u @ [facility]      Medication List    TAKE these medications        ibuprofen 600 MG tablet  Commonly known as:  ADVIL,MOTRIN  Take 1 tablet (600 mg total) by mouth every 6 (six) hours.     prenatal multivitamin Tabs tablet  Take 1 tablet by mouth daily.       Follow-up Information    Follow up with [facility] In 6 weeks.         Acie FredricksonBryson Shepherd, PA-S2  [facility] fellow attestation I have seen and examined this patient and agree with above documentation in the student's note.   Stephanie Beltran is a [age] y.o. G1P1001 s/p NSVD. Postpartum course has been uncomplicated.   Pain is well controlled.  Plan for birth control is condoms.  Method of Feeding: breast  PE:  BP 121/76 mmHg  Pulse 61  Temp(Src) 98 F (36.7 C) (Oral)  Resp 20  Ht 4\' 10"  (1.473 m)  Wt 181 lb (82.101 kg)  BMI 37.84 kg/m2  SpO2 100%  LMP 06/09/2014  Breastfeeding? Unknown Fundus firm   Recent Labs  03/18/15 0400  HGB 12.6  HCT 36.5     Plan: discharge today - postpartum care discussed - f/u clinic in 6 weeks for postpartum visit   William DaltonMcEachern, Lillah Standre, MD 8:47 AM

## 2015-03-20 NOTE — Progress Notes (Signed)
I assisted Museum/gallery curatorMichelle RN with discharges instructions for mom and baby , by Orlan LeavensViria Alvarez Spanish Interpreter.

## 2015-03-20 NOTE — Lactation Note (Addendum)
This note was copied from the chart of Stephanie Thamar Holik. Lactation Consultation Note Mom BF baby in cradle position, appears to be a good deep latch. Wear #16 NS. Noticed NS needed turned slightly more so opening will allow baby's nose to touch skin. Mom stated Oh yes I remember now. Mom speaks Spanish, FOB interpreter for mom. Mom is post-pumping after BF, not getting anything yet. Hand express colostrum.  Mom is supplementing w/formula in bottle. Mom is breast/bottle on admission. Baby is jaundice. Bili lights d/c this am. Rechecking labs at 1400.  LC out pt. Appt. Made for BF assessment follow-up for April 5, 1;00. Appt. Sheet given with what to bring with her. Stressed to bring baby hungry so we can pre-post feeding weigh. Also to bring NS and pumping kit to assess milk left in breast after BF. Encouraged to bring feeding I&O diary as well. Discussed engorgement management and prevention. Answered questions. Patient Name: Stephanie Beltran NETUY'W Date: 03/20/2015 Reason for consult: Follow-up assessment;Hyperbilirubinemia   Maternal Data    Feeding Feeding Type: Breast Fed Nipple Type: Slow - flow Length of feed: 20 min  LATCH Score/Interventions Latch: Grasps breast easily, tongue down, lips flanged, rhythmical sucking. Intervention(s): Breast compression;Breast massage;Adjust position  Audible Swallowing: A few with stimulation Intervention(s): Hand expression Intervention(s): Alternate breast massage  Type of Nipple: Flat Intervention(s): Double electric pump  Comfort (Breast/Nipple): Soft / non-tender Intervention(s): Double electric pump  Interventions (Mild/moderate discomfort): Breast shields;Post-pump;Hand massage;Hand expression  Hold (Positioning): Assistance needed to correctly position infant at breast and maintain latch. Intervention(s): Support Pillows;Position options;Breastfeeding basics reviewed  LATCH Score: 7  Lactation Tools  Discussed/Used Tools: Nipple [employer] Fuel;Pump Nipple shield size: 16 Breast pump type: Double-Electric Breast Pump   Consult Status Consult Status: Complete Date: 03/20/15    Theodoro Kalata 03/20/2015, 1:03 PM

## 2015-03-27 ENCOUNTER — Ambulatory Visit (HOSPITAL_COMMUNITY)
Admit: 2015-03-27 | Discharge: 2015-03-27 | Disposition: A | Discharge status: Home / Self Care | Payer: Medicaid Other | Attending: Family Medicine | Admitting: Family Medicine

## 2015-03-27 NOTE — Lactation Note (Signed)
Lactation Consult  Mother's reason for visit:  Low milk supply??? #16NS,breast bottle Visit Type:  OP Appointment Notes:  Per mom breast feeding is improving. Patient reports that her breasts fill between feedings and that baby softens them during feedings.  Occasionally Stephanie bites.  Mom is still using the NS to latch the baby. Today we were able to latch her without the NS using an off-center latch.  She had both nutritive and non-nutritive suckles at the breast.  Breast compression was used and she swallowed a bit more.I taught mom to recognize non-nutritive suckles.  Some swallows were heard. SHe transferred 14 ml on the first breast.  She was transferred to the right side and mom was able to latch Stephanie with out the NS.  She is much more active on the right side and more swallowing was heard. Stephanie transferred 20 ml. Family reports that they supplement with 2 oz 3-4 times in 24 hours.  Mom's breasts are relatively wide spaced.  She reports that the areola got darker but that the size did not change much.  Mom post pumped with the symphony and expressed 6 ml.  Explained to mom that Stephanie fell asleep at the breast after a few minutes extra stimulation would be necessary for an increased supply.  SUpply and demand was explained to her as well.  [facility] which she declined and also showed her how to use the piston as a double manual pump. [facility]  Family is very supportive. She will call for a follow-up when she is ready for one. Interpreter present for the entire consult. Follow-up with pediatrician tomorrow.  Plan:  Continue feeding Stephanie Beltran as she has been fed. Post pump 6-8 times a day for 10-15 minutes to stimulate supply.   Consult:   Lactation Consultant:  Soyla DryerJoseph, Maryagnes Carrasco  ________________________________________________________________________  Stephanie FloresBaby's Name: Stephanie Beltran Date of Birth: 03/18/2017 [facility] Gender: female Gestational Age: 4825w2d (At Birth) Birth Weight: 6 lb 14.9 oz (3145 g) Weight at Discharge: Weight: 6 lb 10 oz (3005 g)Date of Dis[facility]: [facility]/20/2015 [facility]   03/18/15 1504 03/18/15 2353 03/19/15 2356  Weight: 6 lb 14.9 oz (3145 g) 6 lb 13.4 oz (3100 g) 6 lb 10 oz (3005 g)   [facility]6 [facility] Location:[facility] 03/26/15 Weight today: 7 # 6.5 oz 3358     ________________________________________________________________________  Mother's Name: Stephanie Beltran Type of delivery:  vaginal Breastfeeding Experience:  p1 Maternal Medical Conditions:  NA Maternal Medications:  PNV  ________________________________________________________________________  Breastfeeding History (Post Discharge)  Frequency of breastfeeding:  12-14 times Duration of feeding:  15-20 minutes  Bottle feeds 2 oz formula 3-4 times in 24 hours  Infant Intake and Output Assessment  Voids:  6-7 in 24 hrs.  Color:  Clear yellow Stools:  5-6 in 24 hrs.  Color:  Yellow  ________________________________________________________________________  Maternal Breast Assessment  Breast:  Filling Nipple:  Short shanked Pain level:  0 Pain interventions:  NA

## 2016-12-22 NOTE — L&D Delivery Note (Signed)
20 y.o. G2P1001 at 8611w1d delivered a viable female infant in cephalic, OA position. No nuchal cord. Left anterior shoulder delivered with ease. 60 sec delayed cord clamping. Cord clamped x2 and cut. Placenta delivered spontaneously intact, with 3VC. Fundus firm on exam with massage and pitocin. Good hemostasis noted.  Anesthesia: None Laceration: Right hymenal ring lac, requiring single figure-eight stitch Suture: 3-0 Vicryl Good hemostasis noted. EBL: 100cc  Mom and baby recovering in LDR.    Apgars: APGAR (1 MIN): 6   APGAR (5 MINS): 9     Weight: Pending skin to skin  Sponge and instrument count were correct x2. Placenta sent to L&D.  Jen MowElizabeth Boaz Berisha, DO OB Fellow [facility] 06/20/2017, 4:05 PM

## 2017-01-01 LAB — OB RESULTS CONSOLE GC/CHLAMYDIA
Chlamydia: NEGATIVE
Gonorrhea: NEGATIVE

## 2017-01-01 LAB — OB RESULTS CONSOLE RPR: RPR: NONREACTIVE

## 2017-01-01 LAB — OB RESULTS CONSOLE HIV ANTIBODY (ROUTINE TESTING): HIV: NONREACTIVE

## 2017-01-01 LAB — OB RESULTS CONSOLE ABO/RH: RH Type: POSITIVE

## 2017-01-01 LAB — OB RESULTS CONSOLE RUBELLA ANTIBODY, IGM: RUBELLA: IMMUNE

## 2017-01-01 LAB — OB RESULTS CONSOLE HEPATITIS B SURFACE ANTIGEN: Hepatitis B Surface Ag: NEGATIVE

## 2017-01-01 LAB — OB RESULTS CONSOLE ANTIBODY SCREEN: Antibody Screen: NEGATIVE

## 2017-05-08 LAB — OB RESULTS CONSOLE GBS: GBS: NEGATIVE

## 2017-05-08 LAB — OB RESULTS CONSOLE GC/CHLAMYDIA
CHLAMYDIA, DNA PROBE: NEGATIVE
Gonorrhea: NEGATIVE

## 2017-05-28 ENCOUNTER — Encounter (HOSPITAL_COMMUNITY): Payer: Self-pay | Admitting: *Deleted

## 2017-05-28 ENCOUNTER — Telehealth (HOSPITAL_COMMUNITY): Payer: Self-pay | Admitting: *Deleted

## 2017-05-28 NOTE — Telephone Encounter (Signed)
Preadmission screen Interpreter number [phone]

## 2017-05-29 ENCOUNTER — Observation Stay (HOSPITAL_COMMUNITY)
Admission: RE | Admit: 2017-05-29 | Discharge: 2017-05-29 | Disposition: A | Discharge status: Home / Self Care | Payer: Self-pay | Source: Ambulatory Visit | Attending: Obstetrics & Gynecology | Admitting: Obstetrics & Gynecology

## 2017-05-29 ENCOUNTER — Encounter (HOSPITAL_COMMUNITY): Payer: Self-pay

## 2017-05-29 VITALS — BP 126/59 | HR 76 | Temp 98.1°F | Resp 20 | Ht 60.0 in | Wt 192.0 lb

## 2017-05-29 DIAGNOSIS — Z3A38 38 weeks gestation of pregnancy: Secondary | ICD-10-CM | POA: Insufficient documentation

## 2017-05-29 DIAGNOSIS — O321XX0 Maternal care for breech presentation, not applicable or unspecified: Principal | ICD-10-CM | POA: Diagnosis present

## 2017-05-29 MED ORDER — LACTATED RINGERS IV SOLN
INTRAVENOUS | Status: DC
  Administered 2017-05-29: 125 mL via INTRAVENOUS

## 2017-05-29 MED ORDER — TERBUTALINE SULFATE 1 MG/ML IJ SOLN
0.2500 mg | Freq: Once | INTRAMUSCULAR | Status: AC
  Administered 2017-05-29: 0.25 mg via SUBCUTANEOUS
  Filled 2017-05-29: qty 1

## 2017-05-29 NOTE — Discharge Summary (Signed)
Physician Discharge Summary  Patient ID: Stephanie Beltran MRN: [id number] DOB/AGE: 23/03/1996 22 y.o.  Admit date: 05/29/2017 Discharge date: 05/29/2017  Admission Diagnoses:4319w0d Breech presentation  Discharge Diagnoses:  Active Problems:   Breech presentation on examination Successful version  Discharged Condition: good  Hospital Course: Patient admitted for ECV at 2419w0d and successful version to cephalic presentation was performed with no complication  Consults: None  Significant Diagnostic Studies: no  Treatments: ECV  Discharge Exam: Blood pressure 123/61, pulse 68, temperature 98.4 F (36.9 C), temperature source Oral, resp. rate 20, height 5' (1.524 m), weight 192 lb (87.1 kg), unknown if currently breastfeeding. General appearance: alert, cooperative and no distress Extremities: extremities normal, atraumatic, no cyanosis or edema  Disposition: 01-Home or Self Care   F/u at HD next week. Labor precautions given   Signed: Scheryl DarterJames Ching Rabideau 05/29/2017, 9:31 AM

## 2017-05-29 NOTE — OB Triage Note (Signed)
External cephalic version successful and patient discharged home in stable condition.  Patient [facility].  Patient educated about s/s of labor and fetal movement.  Patient verbalized understanding with interpreter at bedside.

## 2017-05-29 NOTE — Discharge Instructions (Signed)
Parto vaginal (Vaginal Delivery) Durante el parto, el mdico la ayudar a dar a luz a su beb. En elparto vaginal, deber pujar para que el beb salga por la vagina. Sin embargo, antes de que pueda sacar al beb, es necesario que ocurran ciertas cosas. La abertura del tero (cuello del tero) tiene que ablandarse, hacerse ms delgado y abrirse (dilatar) hasta que llegue a 10 cm. Adems, el beb tiene que bajar desde el tero a la vagina. SIGNOS DE TRABAJO DE PARTO  El mdico tendr primero que asegurarse de que usted est en trabajo de parto. Algunos signos son:   Eliminar lo que se llama tapn mucoso antes del inicio del trabajo de parto. Este es una pequea cantidad de mucosidad teida con sangre.  Tener contracciones uterinas regulares y dolorosas.   El tiempo entre las contracciones debe acortarse  Las molestias y el dolor se harn ms intensos gradualmente.  El dolor de las contracciones empeora al caminar y no se alivia con el reposo.   El cuello del tero se hace mas delgado (se borra) y se dilata. ANTES DEL PARTO Una vez que se inicie el trabajo de parto y sea admitida en el hospital o sanatorio, el mdico podr hacer lo siguiente:   Realizar un examen fsico.  Controlar si hay complicaciones relacionadas con el trabajo de parto.  Verificar su presin arterial, temperatura y pulso y la frecuencia cardaca (signos vitales).   Determinar si se ha roto el saco amnitico y cundo ha ocurrido.  Realizar un examen vaginal (utilizando un guante estril y un lubricante) para determinar: ? La posicin (presentacin) del beb. El beb se presenta con la cabeza primero (vertex) en el canal de parto (vagina), o estn los pies o las nalgas primero (de nalgas)? ? El nivel (estacin) de la cabeza del beb dentro del canal de parto. ? El borramiento y la dilatacin del cuello uterino  El monitor fetal electrnico generalmente se coloca sobre el abdomen al llegar. Se utiliza para  controlar las contracciones y la frecuencia cardaca del beb. ? Cuando el monitor est en el abdomen (monitor fetal externo), slo toma la frecuencia y la duracin de las contracciones. No informa acerca de la intensidad de las contracciones. ? Si el mdico necesita saber exactamente la intensidad de las contracciones o cul es la frecuencia cardaca del beb, colocar un monitor interno en la vagina y el tero. El mdico comentar los riesgos y los beneficios de usar un monitor interno y le pedir autorizacin antes de colocar el dispositivo. ? El monitoreo fetal continuo ser necesario si le han aplicado una epidural, si le administran ciertos medicamentos (como oxitocina) y si tiene complicaciones del embarazo o del trabajo de parto.  Podrn colocarle una va intravenosa en una vena del brazo para suministrarle lquidos y medicamentos, si es necesario. TRES ETAPAS DEL TRABAJO DE PARTO Y EL PARTO El trabajo de parto y el parto normales se dividen en tres etapas. Primera etapa Esta etapa comienza cuando comienzan las contracciones regulares y el cuello comienza a borrarse y dilatarse. Finaliza cuando el cuello est completamente abierto (completamente dilatado). La primera etapa es la etapa ms larga del trabajo de parto y puede durar desde 3 horas a 15 horas.  Algunos mtodos estn disponibles para ayudar con el dolor del parto. Usted y su mdico decidirn qu opcin es la mejor para usted. Las opciones incluyen:   Medicamentos narcticos. Estos son medicamentos fuertes que usted puede recibir a travs de una va intravenosa o   como inyeccin en el msculo. Estos medicamentos alivian el dolor pero no hacen que desaparezca completamente.  Epidural. Se administra un medicamento a travs de un tubo delgado que se inserta en la espalda. El medicamento adormece la parte inferior del cuerpo y evita el dolor en esa zona.  Bloqueo paracervical Es una inyeccin de un anestsico en cada lado del cuello  uterino.  Usted podr pedir un parto natural, que implica que no se usen analgsicos ni epidural durante el parto y el trabajo de parto. En cambio, podr tener otro tipo de ayuda como ejercicios respiratorios para hacer frente al dolor. Segunda etapa La segunda etapa del trabajo de parto comienza cuando el cuello se ha dilatado completamente a 10 cm. Contina hasta que usted puja al beb hacia abajo, por el canal de parto, y el beb nace. Esta etapa puede durar slo algunos minutos o algunas horas.  La posicin del la cabeza del beb a medida que pasa por el canal de parto, es informada como un nmero, llamado estacin. Si la cabeza del beb no ha iniciado su descenso, la estacin se describe como que est en menos 3 (-3). Cuando la cabeza del beb est en la estacin cero, est en el medio del canal de parto y se encaja en la pelvis. La estacin en la que se encuentra el beb indica el progreso de la segunda etapa del trabajo de parto.  Cuando el beb nace, el mdico lo sostendr con la cabeza hacia abajo para evitar que el lquido amnitico, el moco y la sangre entren en los pulmones del beb. La boca y la nariz del beb podrn ser succionadas con un pequeo bulbo para retirar todo lquido adicional.  El mdico podr colocar al beb sobre su estmago. Es importante evitar que el beb tome fro. Para hacerlo, el mdico secar al beb, lo colocar directamente sobre su piel, (sin mantas entre usted y el beb) y lo cubrir con mantas secas y tibias.  Se corta el cordn umbilical. Tercera etapa Durante la tercera etapa del trabajo de parto, el mdico sacar la placenta (alumbramiento) y se asegurar de que el sangrado est controlado. La salida de la placenta generalmente demora 5 minutos pero puede tardar hasta 30 minutos. Luego de la salida de la placenta, le darn un medicamento por va intravenosa o inyectable para ayudar a contraer el tero y controlar el sangrado. Si planea amamantar al beb,  puede intentar en este momento Luego de la salida de la placenta, el tero debe contraerse y quedar muy firme. Si el tero no queda firme, el mdico lo masajear. Esto es importante debido a que la contraccin del tero ayuda a cortar el sangrado en el sitio en que la placenta estaba unida al tero. Si el tero no se contrae adecuadamente ni permanece firme, podr causar un sangrado abundante. Si hay mucho sangrado, podrn darle medicamentos para contraer el tero y detener el sangrado.  Esta informacin no tiene como fin reemplazar el consejo del mdico. Asegrese de hacerle al mdico cualquier pregunta que tenga. Document Released: 11/20/2008 Document Revised: 12/29/2014 Elsevier Interactive Patient Education  2017 Elsevier Inc.  

## 2017-05-29 NOTE — Progress Notes (Signed)
External Cephalic Version  Preprocedure Diagnosis:  21 y.o. G2P1 at 7029w0d weeks gestational age with breech presentation  Post-procedure Diagnosis: 21 y.o. G2P1 at 5329w0d weeks gestational age with successful version to cephalic presentation  Procedure:  External cephalic version  Procedure in detail:   The patient was brought to Labor and Delivery where a reactive fetal heart tracing was obtained. The patient was noted to have irregular contractions. She was given 1 dose of subcutaneous terbutaline which resolved her contraction. A bedside ultrasound was performed which revealed single intrauterine pregnancy and breech presentation. There was noted to be adequate fluid. Using manual pressure, the breech was manipulated in a back roll fashion until a vertex presentation was obtained. Fetal heart tones were checked intermittently during the procedure and were noted to be reassuring. Following successful external cephalic version, the patient was placed on continuous external fetal monitoring. She was noted to have a reassuring and reactive tracing for 1 hour following the external cephalic version. She did not have regular contractions and therefore she was felt to be stable for discharge to home. She was given appropriate labor instructions.   Adam PhenixArnold, Lowell Mcgurk G, MD 05/29/2017 9:41 AM

## 2017-05-29 NOTE — H&P (Signed)
Stephanie Beltran is a 21 y.o. female presenting for external cephalic version. . OB History    Gravida Para Term Preterm AB Living   2 1 1     1    SAB TAB Ectopic Multiple Live Births         0 1     Past Medical History:  Diagnosis Date  . History of anemia   . Medical history non-contributory    Past Surgical History:  Procedure Laterality Date  . NO PAST SURGERIES     Family History: family history is not on file. Social History:  reports that she has never smoked. She has never used smokeless tobacco. She reports that she does not drink alcohol or use drugs.     Maternal Diabetes: No Genetic Screening: Declined Maternal Ultrasounds/Referrals: Normal Fetal Ultrasounds or other Referrals:  None Maternal Substance Abuse:  No Significant Maternal Medications:  None Significant Maternal Lab Results:  None Other Comments:  None  Review of Systems  Respiratory: Negative.   Cardiovascular: Negative.    Maternal Medical History:  Reason for admission: Breech presentation for ECV      Blood pressure 123/61, pulse 68, temperature 98.4 F (36.9 C), temperature source Oral, resp. rate 20, height 5' (1.524 m), weight 192 lb (87.1 kg), unknown if currently breastfeeding. Maternal Exam:  Abdomen: Patient reports no abdominal tenderness. Fetal presentation: breech  Introitus: not evaluated.   Cervix: not evaluated.   Physical Exam  Prenatal labs: ABO, Rh: A/Positive/-- (01/11 0000) Antibody: Negative (01/11 0000) Rubella: Immune (01/11 0000) RPR: Nonreactive (01/11 0000)  HBsAg: Negative (01/11 0000)  HIV: Non-reactive (01/11 0000)  GBS: Negative (05/18 0000)   Assessment/Plan: ECV with [place] guidance. External Cephalic Version  The procedure and risk of failure, pain, labor bleeding ROM, emergency cesarean section explained with interpreter and questions were answered, consent signed   Scheryl DarterJames Arnold 05/29/2017, 8:50 AM

## 2017-06-12 ENCOUNTER — Other Ambulatory Visit: Payer: Self-pay | Admitting: Advanced Practice Midwife

## 2017-06-15 ENCOUNTER — Encounter (HOSPITAL_COMMUNITY): Payer: Self-pay | Admitting: *Deleted

## 2017-06-15 ENCOUNTER — Telehealth (HOSPITAL_COMMUNITY): Payer: Self-pay | Admitting: *Deleted

## 2017-06-15 NOTE — Telephone Encounter (Signed)
Preadmission screen [id number] interpreter number

## 2017-06-17 ENCOUNTER — Other Ambulatory Visit (HOSPITAL_COMMUNITY): Payer: Self-pay | Admitting: Nurse Practitioner

## 2017-06-17 ENCOUNTER — Ambulatory Visit (HOSPITAL_COMMUNITY)
Admission: RE | Admit: 2017-06-17 | Discharge: 2017-06-17 | Disposition: A | Discharge status: Home / Self Care | Payer: Self-pay | Source: Ambulatory Visit | Attending: Obstetrics & Gynecology | Admitting: Obstetrics & Gynecology

## 2017-06-17 DIAGNOSIS — O480 Post-term pregnancy: Secondary | ICD-10-CM

## 2017-06-17 DIAGNOSIS — Z3A40 40 weeks gestation of pregnancy: Secondary | ICD-10-CM | POA: Insufficient documentation

## 2017-06-17 DIAGNOSIS — Z9289 Personal history of other medical treatment: Secondary | ICD-10-CM

## 2017-06-20 ENCOUNTER — Encounter (HOSPITAL_COMMUNITY): Payer: Self-pay

## 2017-06-20 ENCOUNTER — Inpatient Hospital Stay (HOSPITAL_COMMUNITY)
Admission: RE | Admit: 2017-06-20 | Discharge: 2017-06-21 | DRG: 775 | Disposition: A | Discharge status: Home / Self Care | Payer: Medicaid Other | Source: Ambulatory Visit | Attending: Family Medicine | Admitting: Family Medicine

## 2017-06-20 VITALS — BP 120/63 | HR 78 | Temp 98.1°F | Resp 18 | Ht 60.0 in | Wt 195.0 lb

## 2017-06-20 DIAGNOSIS — Z3A41 41 weeks gestation of pregnancy: Secondary | ICD-10-CM

## 2017-06-20 DIAGNOSIS — O480 Post-term pregnancy: Secondary | ICD-10-CM | POA: Diagnosis present

## 2017-06-20 LAB — TYPE AND SCREEN
ABO/RH(D): A POS
ANTIBODY SCREEN: NEGATIVE

## 2017-06-20 LAB — CBC
HCT: 37.4 % (ref 36.0–46.0)
Hemoglobin: 12.9 g/dL (ref 12.0–15.0)
MCH: 29.7 pg (ref 26.0–34.0)
MCHC: 34.5 g/dL (ref 30.0–36.0)
MCV: 86 fL (ref 78.0–100.0)
PLATELETS: 192 10*3/uL (ref 150–400)
RBC: 4.35 MIL/uL (ref 3.87–5.11)
RDW: 13.3 % (ref 11.5–15.5)
WBC: 7.4 10*3/uL (ref 4.0–10.5)

## 2017-06-20 LAB — RPR: RPR Ser Ql: NONREACTIVE

## 2017-06-20 MED ORDER — ONDANSETRON HCL 4 MG/2ML IJ SOLN: 4.0000 mg | Freq: Four times a day (QID) | INTRAMUSCULAR | Status: DC | PRN

## 2017-06-20 MED ORDER — LIDOCAINE HCL (PF) 1 % IJ SOLN
30.0000 mL | INTRAMUSCULAR | Status: DC | PRN
  Filled 2017-06-20: qty 30

## 2017-06-20 MED ORDER — OXYTOCIN 40 UNITS IN LACTATED RINGERS INFUSION - SIMPLE MED
1.0000 m[IU]/min | INTRAVENOUS | Status: DC
  Administered 2017-06-20: 2 m[IU]/min via INTRAVENOUS

## 2017-06-20 MED ORDER — ACETAMINOPHEN 325 MG PO TABS: 650.0000 mg | ORAL_TABLET | ORAL | Status: DC | PRN

## 2017-06-20 MED ORDER — OXYTOCIN BOLUS FROM INFUSION
500.0000 mL | Freq: Once | INTRAVENOUS | Status: AC
  Administered 2017-06-20: 500 mL via INTRAVENOUS

## 2017-06-20 MED ORDER — WITCH HAZEL-GLYCERIN EX PADS: 1.0000 "application " | MEDICATED_PAD | CUTANEOUS | Status: DC | PRN

## 2017-06-20 MED ORDER — SODIUM CHLORIDE 0.9% FLUSH: 3.0000 mL | Freq: Two times a day (BID) | INTRAVENOUS | Status: DC

## 2017-06-20 MED ORDER — ONDANSETRON HCL 4 MG/2ML IJ SOLN: 4.0000 mg | INTRAMUSCULAR | Status: DC | PRN

## 2017-06-20 MED ORDER — OXYTOCIN 40 UNITS IN LACTATED RINGERS INFUSION - SIMPLE MED
2.5000 [IU]/h | INTRAVENOUS | Status: DC
  Filled 2017-06-20: qty 1000

## 2017-06-20 MED ORDER — TETANUS-DIPHTH-ACELL PERTUSSIS 5-2.5-18.5 LF-MCG/0.5 IM SUSP: 0.5000 mL | Freq: Once | INTRAMUSCULAR | Status: DC

## 2017-06-20 MED ORDER — ONDANSETRON HCL 4 MG PO TABS: 4.0000 mg | ORAL_TABLET | ORAL | Status: DC | PRN

## 2017-06-20 MED ORDER — LACTATED RINGERS IV SOLN
INTRAVENOUS | Status: DC
  Administered 2017-06-20 (×2): via INTRAVENOUS

## 2017-06-20 MED ORDER — IBUPROFEN 600 MG PO TABS
600.0000 mg | ORAL_TABLET | Freq: Four times a day (QID) | ORAL | Status: DC
  Administered 2017-06-20 – 2017-06-21 (×4): 600 mg via ORAL
  Filled 2017-06-20 (×5): qty 1

## 2017-06-20 MED ORDER — MISOPROSTOL 25 MCG QUARTER TABLET
25.0000 ug | ORAL_TABLET | ORAL | Status: DC | PRN
  Administered 2017-06-20: 25 ug via VAGINAL
  Filled 2017-06-20 (×2): qty 1

## 2017-06-20 MED ORDER — TERBUTALINE SULFATE 1 MG/ML IJ SOLN
0.2500 mg | Freq: Once | INTRAMUSCULAR | Status: DC | PRN
  Filled 2017-06-20: qty 1

## 2017-06-20 MED ORDER — LACTATED RINGERS IV SOLN: 500.0000 mL | INTRAVENOUS | Status: DC | PRN

## 2017-06-20 MED ORDER — ZOLPIDEM TARTRATE 5 MG PO TABS: 5.0000 mg | ORAL_TABLET | Freq: Every evening | ORAL | Status: DC | PRN

## 2017-06-20 MED ORDER — BENZOCAINE-MENTHOL 20-0.5 % EX AERO: 1.0000 "application " | INHALATION_SPRAY | CUTANEOUS | Status: DC | PRN

## 2017-06-20 MED ORDER — DIPHENHYDRAMINE HCL 25 MG PO CAPS: 25.0000 mg | ORAL_CAPSULE | Freq: Four times a day (QID) | ORAL | Status: DC | PRN

## 2017-06-20 MED ORDER — SODIUM CHLORIDE 0.9% FLUSH
3.0000 mL | INTRAVENOUS | Status: DC | PRN
  Administered 2017-06-20: 3 mL via INTRAVENOUS
  Filled 2017-06-20: qty 3

## 2017-06-20 MED ORDER — OXYCODONE-ACETAMINOPHEN 5-325 MG PO TABS: 2.0000 | ORAL_TABLET | ORAL | Status: DC | PRN

## 2017-06-20 MED ORDER — SODIUM CHLORIDE 0.9 % IV SOLN: 250.0000 mL | INTRAVENOUS | Status: DC | PRN

## 2017-06-20 MED ORDER — PRENATAL MULTIVITAMIN CH
1.0000 | ORAL_TABLET | Freq: Every day | ORAL | Status: DC
  Administered 2017-06-21: 1 via ORAL
  Filled 2017-06-20: qty 1

## 2017-06-20 MED ORDER — FENTANYL CITRATE (PF) 100 MCG/2ML IJ SOLN
100.0000 ug | INTRAMUSCULAR | Status: DC | PRN
  Administered 2017-06-20 (×3): 100 ug via INTRAVENOUS
  Filled 2017-06-20 (×3): qty 2

## 2017-06-20 MED ORDER — SOD CITRATE-CITRIC ACID 500-334 MG/5ML PO SOLN: 30.0000 mL | ORAL | Status: DC | PRN

## 2017-06-20 MED ORDER — DIBUCAINE 1 % RE OINT: 1.0000 "application " | TOPICAL_OINTMENT | RECTAL | Status: DC | PRN

## 2017-06-20 MED ORDER — SIMETHICONE 80 MG PO CHEW: 80.0000 mg | CHEWABLE_TABLET | ORAL | Status: DC | PRN

## 2017-06-20 MED ORDER — OXYCODONE-ACETAMINOPHEN 5-325 MG PO TABS: 1.0000 | ORAL_TABLET | ORAL | Status: DC | PRN

## 2017-06-20 MED ORDER — SENNOSIDES-DOCUSATE SODIUM 8.6-50 MG PO TABS
2.0000 | ORAL_TABLET | ORAL | Status: DC
  Administered 2017-06-20: 2 via ORAL
  Filled 2017-06-20: qty 2

## 2017-06-20 MED ORDER — COCONUT OIL OIL: 1.0000 "application " | TOPICAL_OIL | Status: DC | PRN

## 2017-06-20 MED ORDER — OXYTOCIN 40 UNITS IN LACTATED RINGERS INFUSION - SIMPLE MED: 2.5000 [IU]/h | INTRAVENOUS | Status: DC | PRN

## 2017-06-20 NOTE — Anesthesia Pain Management Evaluation Note (Signed)
  CRNA Pain Management Visit Note  Patient: Stephanie Beltran, 21 y.o., female  "Hello I am a member of the anesthesia team at [facility]. We have an anesthesia team available at all times to provide care throughout the hospital, including epidural management and anesthesia for C-section. I don't know your plan for the delivery whether it a natural birth, water birth, IV sedation, nitrous supplementation, doula or epidural, but we want to meet your pain goals."   1.Was your pain managed to your expectations on prior hospitalizations?   Yes   2.What is your expectation for pain management during this hospitalization?     IV pain meds  3.How can we help you reach that goal? Support prn  Record the patient's initial score and the patient's pain goal.   Pain: 8  Pain Goal: 9 [facility] wants you to be able to say your pain was always managed very well.  [facility],Stephanie Beltran 06/20/2017

## 2017-06-20 NOTE — H&P (Signed)
Stephanie Beltran is a 21 y.o. female G2P1001 at 41.1 weeks presenting for induction of labor. She reports irregular contractions. She denies leaking or vaginal bleeding, and reports good fetal movement. [facility] and denies any problems. She was seen for a ECV at 38 weeks that was successful.   OB History    Gravida Para Term Preterm AB Living   2 1 1     1    SAB TAB Ectopic Multiple Live Births         0 1     Past Medical History:  Diagnosis Date  . History of anemia   . Medical history non-contributory    Past Surgical History:  Procedure Laterality Date  . NO PAST SURGERIES     Family History: family history is not on file. Social History:  reports that she has never smoked. She has never used smokeless tobacco. She reports that she does not drink alcohol or use drugs.    Maternal Diabetes: No Genetic Screening: Normal Maternal Ultrasounds/Referrals: Normal Fetal Ultrasounds or other Referrals:  None Maternal Substance Abuse:  No Significant Maternal Medications:  None Significant Maternal Lab Results:  Lab values include: Group B Strep negative Other Comments:  None  Review of Systems  Constitutional: Negative.  Negative for chills and fever.  Respiratory: Negative.  Negative for cough.   Cardiovascular: Negative.  Negative for chest pain.  Gastrointestinal: Positive for abdominal pain.  Genitourinary: Negative.   Neurological: Negative.  Negative for headaches.   Maternal Medical History:  Reason for admission: IOL for postdates  Contractions: Frequency: irregular.   Duration is approximately 80 seconds.   Perceived severity is mild.    Fetal activity: Perceived fetal activity is normal.   Last perceived fetal movement was within the past hour.    Prenatal complications: no prenatal complications Prenatal Complications - Diabetes: none.    Dilation: Fingertip Effacement (%): Thick Station: Ballotable Exam by:: Caroline, CNM-S unknown  if currently breastfeeding. Maternal Exam:  Uterine Assessment: Contraction strength is mild.  Contraction duration is 80 seconds. Contraction frequency is rare.   Abdomen: Patient reports no abdominal tenderness. Introitus: Normal vulva. Normal vagina.  Ferning test: not done.  Nitrazine test: not done. Amniotic fluid character: not assessed.  Cervix: Cervix evaluated by digital exam.     Fetal Exam Fetal Monitor Review: Mode: ultrasound.   Baseline rate: 130.  Variability: moderate (6-25 bpm).   Pattern: accelerations present and no decelerations.    Fetal State Assessment: Category I - tracings are normal.     Physical Exam  Nursing note and vitals reviewed. Constitutional: She is oriented to person, place, and time. She appears well-developed and well-nourished.  HENT:  Head: Normocephalic and atraumatic.  Eyes: Conjunctivae are normal. No scleral icterus.  Cardiovascular: Normal rate, regular rhythm and normal heart sounds.   Respiratory: Effort normal and breath sounds normal. No respiratory distress.  GI: Soft. She exhibits no distension. There is no tenderness.  Genitourinary: Vagina normal.  Neurological: She is alert and oriented to person, place, and time.  Skin: Skin is warm and dry.  Psychiatric: She has a normal mood and affect. Her behavior is normal. Judgment and thought content normal.    Prenatal labs: ABO, Rh: A/Positive/-- (01/11 0000) Antibody: Negative (01/11 0000) Rubella: Immune (01/11 0000) RPR: Nonreactive (01/11 0000)  HBsAg: Negative (01/11 0000)  HIV: Non-reactive (01/11 0000)  GBS: Negative (05/18 0000)   Assessment/Plan: IUP at 41.1 weeks. IOL for postdates. GBS  neg.  Admit to birthing suites Vertex presentation confirmed with u/s Cytotec for cervical ripening>foley when able>pitocin MOF: both MOC: nexplanon   Cleone Slim SNM 06/20/2017, 12:58 AM  I confirm that I have verified the information documented in the Student  Midwife's note and that I have also personally reperformed the physical exam and all medical decision making activities.  Bedside US performed by me for confirmation of presentation Vertex presentation seen, + saggital suture  Aviva Signs, CNM

## 2017-06-20 NOTE — Progress Notes (Signed)
Patient ID: Stephanie Beltran, female   DOB: 09/03/1996, 21 y.o.   MRN: [id number]  S: Patient seen & examined for progress of labor. Patient feeling much more uncomfortable and a lot of vaginal pressure. She does not want an epidural   O:  Vitals:   06/20/17 0925 06/20/17 1149 06/20/17 1235 06/20/17 1403  BP: 132/86 140/72 (!) 143/79 115/64  Pulse: 61 72 60 70  Resp:  18    Temp: 97.9 F (36.6 C) 98.1 F (36.7 C)    TempSrc: Oral Oral    Weight: 195 lb (88.5 kg)     Height: 5' (1.524 m)       Dilation: 8 Effacement (%): 80 Cervical Position: Anterior Station: 0 Presentation: Vertex Exam by:: middleton   FHT: 140bpm, mod var, +accels, early decels TOCO: q2-533min   A/P: Continue pitocin Continue expectant management Anticipate SVD

## 2017-06-20 NOTE — Progress Notes (Signed)
Patient ID: Dot LanesMaria Garcia-Maya, female   DOB: 01/04/1996, 21 y.o.   MRN: [id number]  S: Patient seen & examined for progress of labor. Patient getting uncomfortable with contractions, requesting pain medication. Patient's water just recently spontaneously ruptured, mod mec with particulate.    O:  Vitals:   06/20/17 0423 06/20/17 0708 06/20/17 0726 06/20/17 0925  BP: 128/72 135/80  132/86  Pulse: 72   61  Resp: 18     Temp: 98.4 F (36.9 C)  97.7 F (36.5 C)   TempSrc: Oral  Oral     Dilation: 7 Effacement (%): 70 Cervical Position: Anterior Station: -1 Presentation: Vertex Exam by:: middleton rn   FHT: 130bpm, mod var, +accels, no decels TOCO: q2-454min   A/P: SROM - mod mec with particulate Cat I FHT Continue expectant management Anticipate SVD

## 2017-06-20 NOTE — Progress Notes (Signed)
Subjective: Patient reporting more pain with contractions.   Objective: Vitals:   06/20/17 0033 06/20/17 0423 06/20/17 0708  BP: 119/74 128/72 135/80  Pulse: 67 72   Resp: 16 18   Temp: 98.6 F (37 C) 98.4 F (36.9 C)   TempSrc: Oral Oral    FHT:  FHR: 130 bpm, variability: moderate,  accelerations:  Present,  decelerations:  Absent UC:   regular, every 1-2 minutes SVE:   Dilation: 1 Effacement (%): 70 Station: -2 Exam by:: Lake Bells. Neil, SCNM  Labs: Lab Results  Component Value Date   WBC 7.4 06/20/2017   HGB 12.9 06/20/2017   HCT 37.4 06/20/2017   MCV 86.0 06/20/2017   PLT 192 06/20/2017    Assessment / Plan: IUP at term. IOL PD. GBS neg  Discussed with patient risks and benefits of foley bulb for cervical ripening. Patient agreeable to plan. Foley placed without difficulty and inflated with 60cc. Patient tolerate procedure well.  Plan: await foley to fall out. Plan to AROM and possibly start pitocin after.    Stephanie Beltran SNM 06/20/2017, 7:23 AM

## 2017-06-21 NOTE — Plan of Care (Signed)
Problem: Education: Goal: Knowledge of condition will improve Outcome: Completed/Met Date Met: 06/21/17 Using Husband as interpereter (per signed form), Patient was educated on the benefits of skin to skin, which was provided during the PKU.  Educated patient also on the importance of emptying bladder every 2 hours while awake, as well as the possibility of needing a stool softener temporarily.

## 2017-06-21 NOTE — Discharge Instructions (Signed)
Parto vaginal, cuidados de puerperio °(Postpartum Care After Vaginal Delivery) °El período de tiempo que sigue inmediatamente al parto se conoce como puerperio. °¿QUÉ TIPO DE ATENCIÓN MÉDICA RECIBIRÉ? °· Podría continuar recibiendo medicamentos y líquidos través de una vía intravenosa (IV) que se colocará en una de sus venas. °· Si se le realizó una incisión cerca de la vagina (episiotomía) o si ha tenido algún desgarro durante el parto, podrían indicarle que se coloque compresas frías sobre la episiotomía o el desgarro. Esto ayuda a aliviar el dolor y la hinchazón. °· Es posible que le den una botella rociadora para que use cuando vaya al baño. Puede utilizarla hasta que se sienta cómoda limpiándose de la manera habitual. Siga los pasos a continuación para usar la botella rociadora: °? Antes de orinar, llene la botella rociadora con agua tibia. No use agua caliente. °? Después de orinar, mientras aún está sentada en el inodoro, use la botella rociadora para enjuagar el área alrededor de la uretra y la abertura vaginal. Con esto podrá limpiar cualquier rastro de orina y sangre. °? Puede hacer esto en lugar de secarse. Cuando comience a sanar, podrá usar la botella rociadora antes de secarse. Asegúrese de secarse suavemente. °? Llene la botella rociadora con agua limpia cada vez que vaya al baño. °· Deberá usar apósitos sanitarios. °¿CÓMO PUEDO SENTIRME? °· Quizás no tenga necesidad de orinar durante varias horas después del parto. °· Sentirá algo de dolor y molestias en el abdomen y la vagina. °· Si está amamantando, podría tener contracciones uterinas cada vez que lo haga. Estas podrían prolongarse hasta varias semanas durante el puerperio. Las contracciones uterinas ayudan al útero a regresar a su tamaño habitual. °· Es normal tener un poco de hemorragia vaginal (loquios) después del parto. La cantidad y apariencia de los loquios a menudo es similar a las del período menstrual la primera semana después del parto.  Disminuirá gradualmente las siguientes semanas hasta convertirse en una descarga seca amarronada o amarillenta. En la mayoría de las mujeres, los loquios se detienen completamente entre 6 a 8 semanas después del parto. Los sangrados vaginales pueden variar de mujer a mujer. °· Los primeros días después del parto, podría padecer congestión mamaria. Los pechos se sentirán pesados, llenos y molestos. Las mamas también podrían latir y ponerse duras, muy tirantes, calientes y sensibles al tacto. Cuando esto ocurra, podría notar leche que se escapa de los senos. El médico puede recomendarle algunos métodos para aliviar este malestar causado por la congestión mamaria. La congestión mamaria debería desaparecer al cabo de unos días. °· Podría sentirse más deprimida o preocupada que lo habitual debido a los cambios hormonales luego del parto. Estos sentimientos no deben durar más de unos pocos días. Si no desaparecen al cabo de algunos días, hable con su médico. °¿QUÉ CUIDADOS DEBO TENER? °· Infórmele a su médico si siente dolor o malestar. °· Beba suficiente agua para mantener la orina clara o de color amarillo pálido. °· Lávese bien las manos con agua y jabón durante al menos 20 segundos después de cambiar el apósito sanitario, usar el baño o antes de sostener o alimentar al bebé. °· Si no está amamantando, evite tocarse mucho los senos. Al hacerlo, podrían producir más leche. °· Si se siente débil o mareada, o si siente que está a punto de desmayarse, pida ayuda antes de realizar lo siguiente: °? Levantarse de la cama. °? Ducharse. °· Cambie los apósitos sanitarios con frecuencia. Observe si hay cambios en el flujo, como un aumento repentino en el   volumen, cambios en el color o coágulos sanguíneos de gran tamaño. Si expulsa un coágulo sanguíneo por la vagina, guárdelo para mostrárselo a su médico. No tire la cadena sin que el médico examine el coágulo antes. °· Asegúrese de tener todas las vacunas al día. Esto la ayudará a  estar protegida y a proteger al bebé de determinadas enfermedades. Podría necesitar vacunas antes de dejar el hospital. °· Si lo desea, hable con el médico acerca de los métodos de planificación familiar o control de la natalidad (métodos anticonceptivos). °¿CÓMO PUEDO ESTABLECER LAZOS CON MI BEBÉ? °Pasar tanto tiempo como le sea posible con el bebé es sumamente importante. Durante ese tiempo, usted y su bebé pueden conocerse y desarrollar lazos. Tener al bebé con usted en la habitación le dará tiempo de conocerlo. Esto también puede hacerla sentir más cómoda para atender al bebé. Amamantar también puede ayudarla a crear lazos con el bebé. °¿CÓMO PUEDO PLANIFICAR MI REGRESO A CASA CON EL BEBÉ? °· Asegúrese de tener instalada una butaca en el automóvil. °? La butaca debe contar con la certificación del fabricante para asegurarse de que esté instalada en forma segura. °? Asegúrese de que el bebé quede bien asegurado en la butaca. °· Pregúntele al médico todo lo que necesite saber sobre los cuidados de su bebé. Asegúrese de poder comunicarse con el médico en caso de que tenga preguntas luego de dejar el hospital. °Esta información no tiene como fin reemplazar el consejo del médico. Asegúrese de hacerle al médico cualquier pregunta que tenga. °Document Released: 10/05/2007 Document Revised: 03/31/2016 Document Reviewed: 11/12/2015 °Elsevier Interactive Patient Education © 2018 [employer]vier Inc[employer] ° °

## 2017-06-21 NOTE — Discharge Summary (Signed)
OB Discharge Summary     Patient Name: Stephanie Beltran DOB: 05/05/1996 MRN: [id number]  Date of admission: 06/20/2017 Delivering MD: Jen [facility]MUMAW[facility]   Date of discharge: 06/21/2017  Admitting diagnosis: 41WKS INDUCTION  Intrauterine pregnancy: 9035w1d     Secondary diagnosis:  Active Problems:   Post term pregnancy at [redacted] weeks gestation  Additional problems: none     Discharge diagnosis: Term Pregnancy Delivered                                                                                                Post partum procedures: none  Augmentation: Pitocin, Cytotec and Foley Balloon  Complications: None  Hospital course:  Induction of Labor With Vaginal Delivery   21 y.o. yo [id number] at 3835w1d was admitted to the hospital 06/20/2017 for induction of labor.  Indication for induction: Postdates.  Patient had an uncomplicated labor course as follows: Membrane Rupture Time/Date: 8:50 AM ,06/20/2017   Intrapartum Procedures: Episiotomy: None [1]                                         Lacerations:  Vaginal [6]  Patient had delivery of a Viable infant.  Information for the patient's newborn:  Stephanie Beltran, Stephanie Beltran [[id number]][[id number]]  Delivery Method: Vaginal, Spontaneous Delivery (Filed from Delivery Summary)   06/20/2017  Details of delivery can be found in separate delivery note.  Patient had a routine postpartum course. Patient is discharged home 06/21/17.  Physical exam  Vitals:   06/20/17 1700 06/20/17 1800 06/20/17 2145 06/21/17 0540  BP: (!) 114/59 (!) 108/55 (!) 124/53 120/63  Pulse: 65 72 74 78  Resp: 18 18 18 18   Temp: 98.4 F (36.9 C) 98.7 F (37.1 C) 99 F (37.2 C) 98.1 F (36.7 C)  TempSrc: Oral Oral Oral Oral  SpO2:   100% 99%  Weight:      Height:       General: alert, cooperative and no distress Lochia: appropriate Uterine Fundus: firm Incision: N/A DVT Evaluation: No cords or calf tenderness. No significant calf/ankle edema. Labs: Lab Results   Component Value Date   WBC 7.4 06/20/2017   HGB 12.9 06/20/2017   HCT 37.4 06/20/2017   MCV 86.0 06/20/2017   PLT 192 06/20/2017   No flowsheet data found.  Discharge instruction: per After Visit Summary and "Baby and Me Booklet".  After visit meds:  Allergies as of 06/21/2017   No Known Allergies     Medication List    TAKE these medications   prenatal multivitamin Tabs tablet Take 1 tablet by mouth daily.       Diet: routine diet  Activity: Advance as tolerated. Pelvic rest for 6 weeks.   Outpatient follow up:6 weeks Follow up Appt:No future appointments. Follow up Visit:No Follow-up on file.  Postpartum contraception: Nexplanon  Newborn Data: Live born female  Birth Weight: 7 lb 11.1 oz (3490 g) APGAR: 6, 9  Baby Feeding: Bottle and Breast Disposition:pending   06/21/2017 Stephanie BilisNoah B Monterio Bob,  MD   

## 2017-06-21 NOTE — Lactation Note (Signed)
This note was copied from a baby's chart. Lactation Consultation Note  Patient Name: Stephanie Beltran NGEXB'MToday's Date: 06/21/2017 Reason for consult: Initial assessment In house Spanish interpreter present for visit. Mom reports baby is nursing well. She is breast/bottle feeding by choice but reports offering breast before giving any bottles. Advised Mom baby needs to be at breast 8-12 times in 24 hours and with feeding ques. Discussed risk of early supplementation to BF success. Engorgement care reviewed if needed. Lactation brochure left for review, advised of OP services and support group. Encouraged Mom to call for assist with latch before d/c home.   Maternal Data Has patient been taught Hand Expression?: Yes Does the patient have breastfeeding experience prior to this delivery?: Yes  Feeding Feeding Type: Breast Fed Length of feed: 45 min  LATCH Score/Interventions                      Lactation Tools Discussed/Used Tools: Pump Breast pump type: Manual WIC Program: Yes   Consult Status Consult Status: Complete    Alfred LevinsGranger, Katesha Eichel Ann 06/21/2017, 12:46 PM

## 2017-09-18 IMAGING — US US MFM FETAL BPP W/O NON-STRESS
1 series · 6 of 6 positions shown · non-contrast
Comparison: none

[Series 1: us mfm fetal bpp w/o non-stress · 6 acquisitions, 6 frames shown]
[im 1/6]
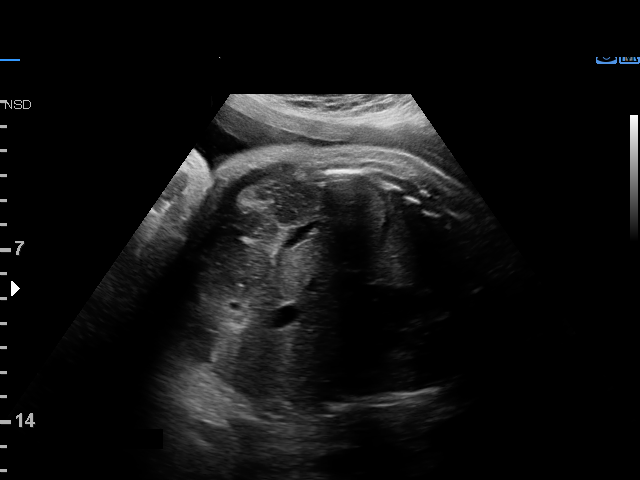
[im 2/6]
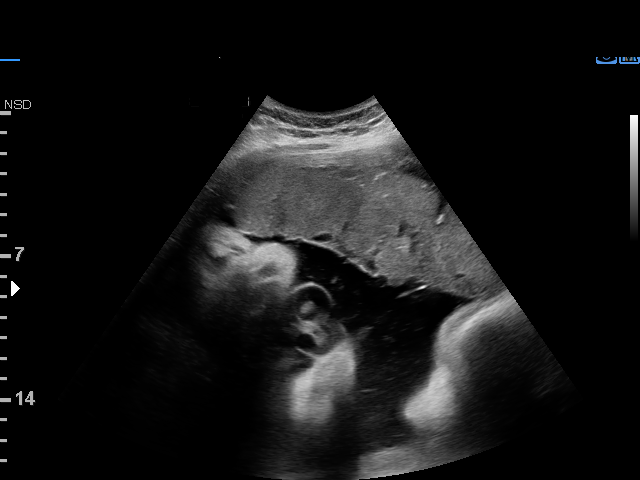
[im 3/6]
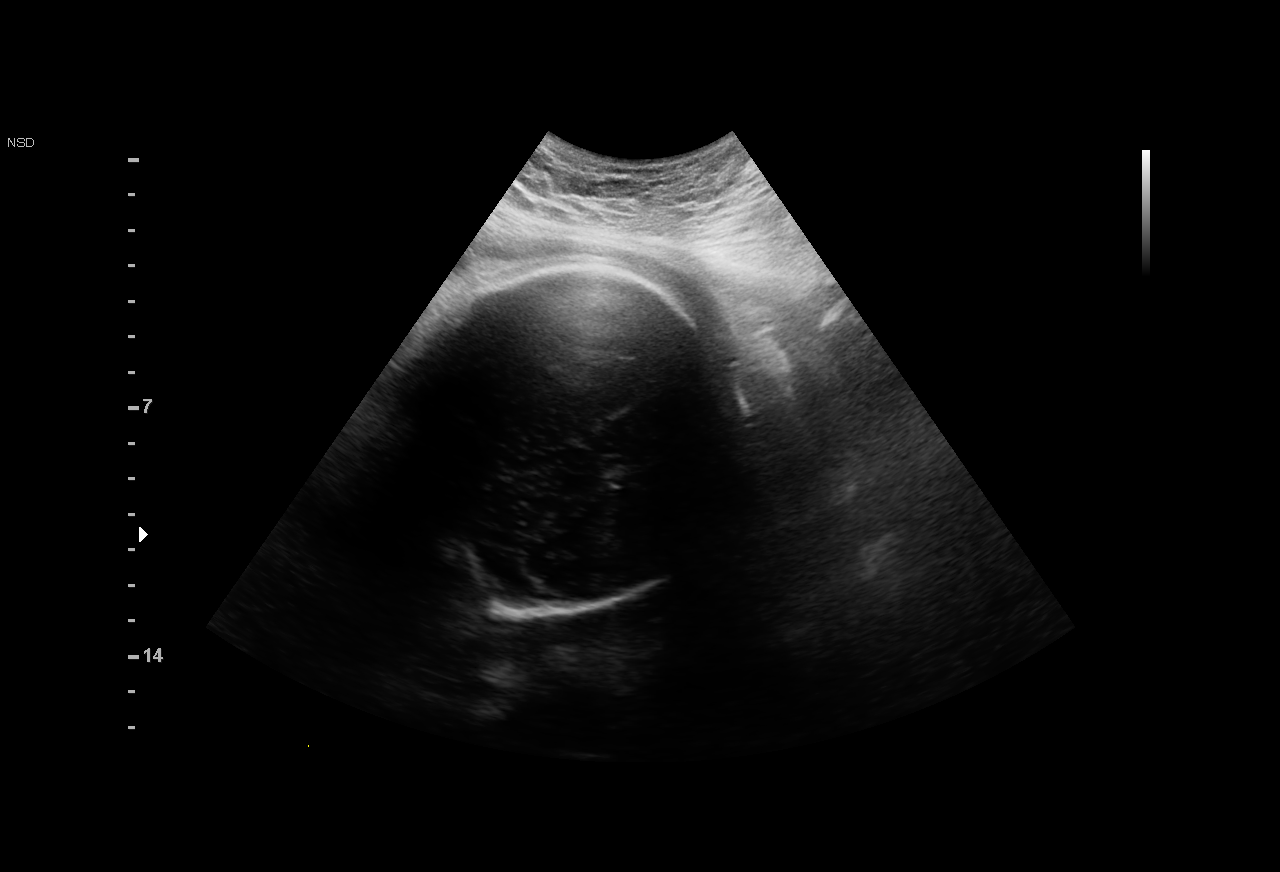
[im 4/6]
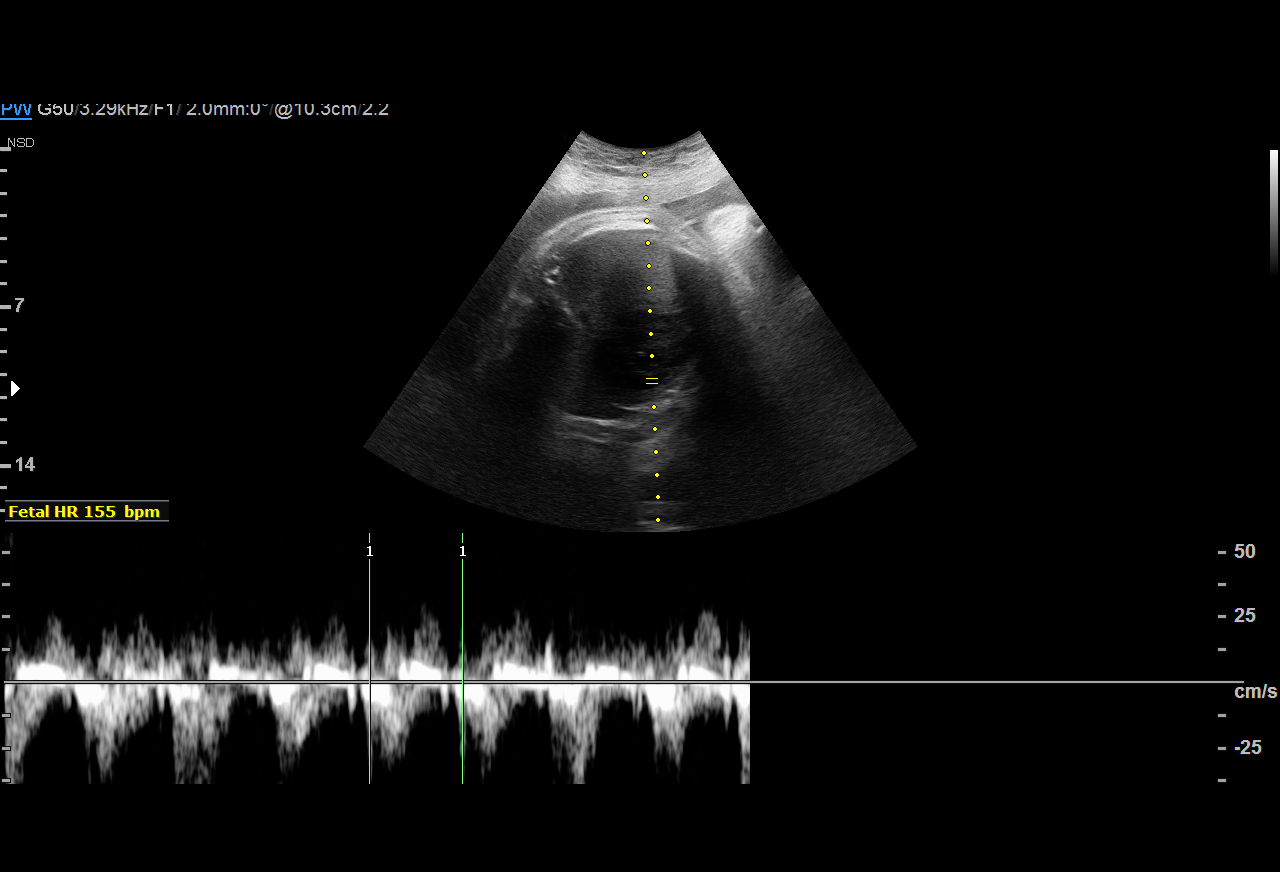
[im 5/6]
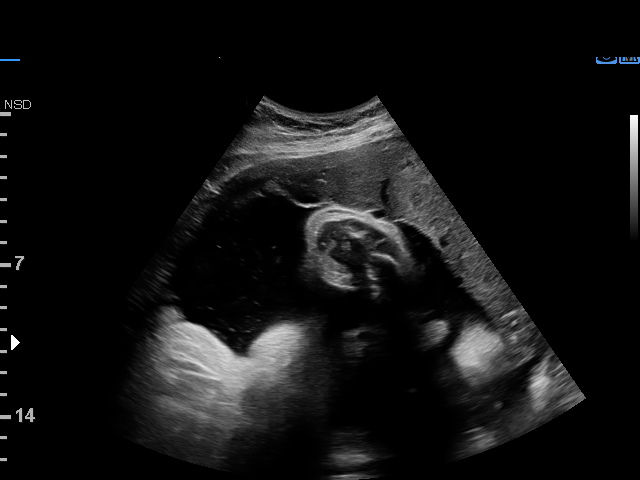
[im 6/6]
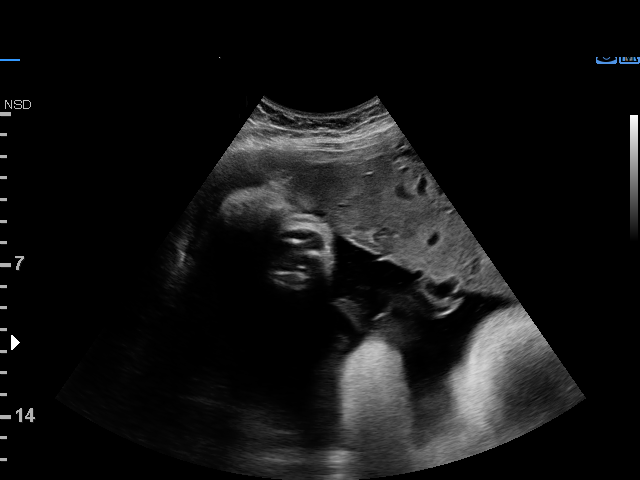

[6 of 6 positions shown; findings below may reference images not displayed]

GILLANDER NP
[REDACTED]-
Faculty Physician

1  DENG MAXWELL           [id number]      [phone]     [id number]
Indications

40 weeks gestation of pregnancy
Postdate pregnancy (40-42 weeks)
OB History

Gravidity:    2         Term:   1        Prem:   0         SAB:   0
TOP:          0       Ectopic:  0        Living: 1
Fetal Evaluation

Num Of Fetuses:     1
Fetal Heart         155
Rate(bpm):
Cardiac Activity:   Observed
Presentation:       Cephalic

Amniotic Fluid
AFI FV:      Subjectively within normal limits

AFI Sum(cm)     %Tile       Largest Pocket(cm)
16.86           78

RUQ(cm)       RLQ(cm)       LUQ(cm)        LLQ(cm)
6.16
Biophysical Evaluation
Amniotic F.V:   Pocket => 2 cm two         F. Tone:         Observed
planes
F. Movement:    Observed                   Score:           [DATE]
F. Breathing:   Observed
Gestational Age

Clinical EDD:  40w 5d                                        EDD:    06/12/17
Best:          40w 5d     Det. By:  Clinical EDD             EDD:    06/12/17
Impression

SIUP at 40+5 weeks
Cephalic presentation
Normal amniotic fluid volume
BPP [DATE]
Recommendations

Follow-up as clinically indicated
